# Patient Record
Sex: Male | Born: 1970 | Hispanic: Refuse to answer | Smoking: Never smoker
Health system: Southern US, Community
[De-identification: ages and names within clinical notes are randomized; demographics above are authoritative.]

## PROBLEM LIST (undated history)

## (undated) DIAGNOSIS — M1990 Unspecified osteoarthritis, unspecified site: Secondary | ICD-10-CM

---

## 1997-10-04 ENCOUNTER — Emergency Department (HOSPITAL_COMMUNITY): Admission: EM | Admit: 1997-10-04 | Discharge: 1997-10-04 | Payer: Self-pay | Admitting: Emergency Medicine

## 2004-08-12 ENCOUNTER — Emergency Department (HOSPITAL_COMMUNITY): Admission: EM | Admit: 2004-08-12 | Discharge: 2004-08-12 | Payer: Self-pay | Admitting: Emergency Medicine

## 2004-08-18 ENCOUNTER — Ambulatory Visit: Payer: Self-pay | Admitting: Orthopedic Surgery

## 2004-09-07 ENCOUNTER — Ambulatory Visit: Payer: Self-pay | Admitting: Orthopedic Surgery

## 2009-10-02 ENCOUNTER — Emergency Department (HOSPITAL_COMMUNITY): Admission: EM | Admit: 2009-10-02 | Discharge: 2009-10-02 | Payer: Self-pay | Admitting: Emergency Medicine

## 2012-08-25 ENCOUNTER — Other Ambulatory Visit: Payer: Self-pay | Admitting: Family Medicine

## 2012-12-24 ENCOUNTER — Other Ambulatory Visit: Payer: Self-pay | Admitting: Family Medicine

## 2013-01-09 ENCOUNTER — Encounter: Payer: Self-pay | Admitting: Family Medicine

## 2013-01-09 ENCOUNTER — Ambulatory Visit (INDEPENDENT_AMBULATORY_CARE_PROVIDER_SITE_OTHER): Payer: BC Managed Care – PPO | Admitting: Family Medicine

## 2013-01-09 VITALS — BP 134/84 | Ht 71.0 in | Wt 288.8 lb

## 2013-01-09 DIAGNOSIS — F339 Major depressive disorder, recurrent, unspecified: Secondary | ICD-10-CM | POA: Insufficient documentation

## 2013-01-09 DIAGNOSIS — G4700 Insomnia, unspecified: Secondary | ICD-10-CM | POA: Insufficient documentation

## 2013-01-09 DIAGNOSIS — F329 Major depressive disorder, single episode, unspecified: Secondary | ICD-10-CM

## 2013-01-09 DIAGNOSIS — K219 Gastro-esophageal reflux disease without esophagitis: Secondary | ICD-10-CM | POA: Insufficient documentation

## 2013-01-09 MED ORDER — ESCITALOPRAM OXALATE 20 MG PO TABS: 20.0000 mg | ORAL_TABLET | Freq: Every day | ORAL | Status: DC

## 2013-01-09 NOTE — Progress Notes (Signed)
  Subjective:    Patient ID: Curtis Robertson, male    DOB: 1970/06/29, 42 y.o.   MRN: [id number]  HPI Patient arrives to follow up on Lexapro. Ran out of meds. Still exercising  Gaining weight. Concerning to pt.  Mos side folks gain weight.  Exercise--lifting weights, but not cardio.  Doing well with no problems or concerns.   Review of Systems No chest pain no back pain no abdominal pain. Some reflux on occasion. Some trouble sleeping at night ROS otherwise negative    Objective:   Physical Exam Alert HEENT normal. Lungs clear. Heart rare rhythm. Abdomen benign. Ankles without edema.       Assessment & Plan:  Impression 1 reflux discussed #2 insomnia discussed. #3 depression discussed plan resume Lexapro 20 mg 1 daily. Patient states he at times breaks [place] in half I encouraged him to try to stick with one dose. Exercise diet discussed symptomatic care discussed. WSL

## 2013-04-30 ENCOUNTER — Encounter: Payer: Self-pay | Admitting: Family Medicine

## 2013-04-30 ENCOUNTER — Ambulatory Visit (HOSPITAL_COMMUNITY)
Admission: RE | Admit: 2013-04-30 | Discharge: 2013-04-30 | Disposition: A | Discharge status: Home / Self Care | Payer: BC Managed Care – PPO | Source: Ambulatory Visit | Attending: Family Medicine | Admitting: Family Medicine

## 2013-04-30 ENCOUNTER — Ambulatory Visit (INDEPENDENT_AMBULATORY_CARE_PROVIDER_SITE_OTHER): Payer: BC Managed Care – PPO | Admitting: Family Medicine

## 2013-04-30 VITALS — BP 138/82 | Ht 71.0 in | Wt 296.8 lb

## 2013-04-30 DIAGNOSIS — M25569 Pain in unspecified knee: Secondary | ICD-10-CM | POA: Insufficient documentation

## 2013-04-30 DIAGNOSIS — M25561 Pain in right knee: Secondary | ICD-10-CM

## 2013-04-30 MED ORDER — NABUMETONE 750 MG PO TABS: 750.0000 mg | ORAL_TABLET | Freq: Every day | ORAL | Status: DC

## 2013-04-30 MED ORDER — NABUMETONE 750 MG PO TABS: 750.0000 mg | ORAL_TABLET | Freq: Two times a day (BID) | ORAL | Status: DC

## 2013-04-30 NOTE — Progress Notes (Signed)
   Subjective:    Patient ID: Curtis CosierJuan F Robertson, male    DOB: 1970-06-25, 43 y.o.   MRN: [id number]  HPI Patient arrives with complaint of right pain for about a year but on Monday while doing squats it got worst and popped this am with intense pain.  Recent pain and tend, catching at times  Plus burning  Taking ibuprofen,  Doing sig squats and lifting of weights  Right knee pain--doing squats had pain and subsequent burn, Review of Systems No pain elsewhere no chest pain no back pain ROS:    Objective:   Physical Exam   Alert no apparent distress. Lungs clear. Heart regular in rhythm. Right knee smile effusion. Positive crepitations palpated. Positive lateral patellar tenderness. No dislocation no joint line tenderness. 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 Orthotopic so. Relafen twice a day. Local measures discussed. Pending back on activities discussed. WSL

## 2013-05-02 ENCOUNTER — Telehealth: Payer: Self-pay | Admitting: Family Medicine

## 2013-05-02 NOTE — Telephone Encounter (Signed)
Ok, let brend know to change to [facility]

## 2013-05-02 NOTE — Telephone Encounter (Signed)
[facility]

## 2013-05-02 NOTE — Progress Notes (Signed)
Discussed with patient. Pt stated he will call back in a couple of hours to let us know who he wants to see.

## 2013-06-16 ENCOUNTER — Other Ambulatory Visit: Payer: Self-pay | Admitting: Orthopaedic Surgery

## 2013-06-16 DIAGNOSIS — M25561 Pain in right knee: Secondary | ICD-10-CM

## 2013-06-22 ENCOUNTER — Ambulatory Visit
Admission: RE | Admit: 2013-06-22 | Discharge: 2013-06-22 | Disposition: A | Discharge status: Home / Self Care | Payer: BC Managed Care – PPO | Source: Ambulatory Visit | Attending: Orthopaedic Surgery | Admitting: Orthopaedic Surgery

## 2013-06-22 DIAGNOSIS — M25561 Pain in right knee: Secondary | ICD-10-CM

## 2014-01-13 ENCOUNTER — Other Ambulatory Visit: Payer: Self-pay | Admitting: Family Medicine

## 2014-03-16 ENCOUNTER — Other Ambulatory Visit: Payer: Self-pay | Admitting: Family Medicine

## 2014-04-07 ENCOUNTER — Encounter: Payer: Self-pay | Admitting: Family Medicine

## 2014-04-07 ENCOUNTER — Ambulatory Visit (INDEPENDENT_AMBULATORY_CARE_PROVIDER_SITE_OTHER): Payer: BC Managed Care – PPO | Admitting: Family Medicine

## 2014-04-07 VITALS — BP 150/90 | Temp 98.2°F | Ht 71.0 in | Wt 314.4 lb

## 2014-04-07 DIAGNOSIS — J329 Chronic sinusitis, unspecified: Secondary | ICD-10-CM

## 2014-04-07 DIAGNOSIS — J310 Chronic rhinitis: Secondary | ICD-10-CM

## 2014-04-07 MED ORDER — ESCITALOPRAM OXALATE 20 MG PO TABS: ORAL_TABLET | ORAL | Status: DC

## 2014-04-07 MED ORDER — AMOXICILLIN-POT CLAVULANATE 875-125 MG PO TABS: 1.0000 | ORAL_TABLET | Freq: Two times a day (BID) | ORAL | Status: AC

## 2014-04-07 NOTE — Progress Notes (Signed)
   Subjective:    Patient ID: Aggie CosierJuan F Oquin, male    DOB: 1970/07/14, 43 y.o.   MRN: [id number]  Sinusitis This is a new problem. The current episode started 1 to 4 weeks ago. The problem is unchanged. There has been no fever. The pain is moderate. Associated symptoms include congestion and ear pain. Past treatments include oral decongestants. The treatment provided no relief.  Patient has no other concerns.   Patient on generic Lexapro. States still definitely helping. Would like to stay on it. No obvious side effects. Review of Systems  HENT: Positive for congestion and ear pain.    No vomiting no diarrhea    Objective:   Physical Exam  Alert active good hydration left TM retracted positive nasal congestion pharynx normal lungs clear heart rare rhythm.      Assessment & Plan:  Impression 1 sinusitis with left otitis media #2 chronic depression/anxiety clinically stable plan Lexapro refilled. Augmentin twice a day 10 days. Local measures discussed. Blood pressure repeat down to 140/84

## 2014-11-26 ENCOUNTER — Other Ambulatory Visit: Payer: Self-pay | Admitting: Family Medicine

## 2014-11-26 NOTE — Telephone Encounter (Signed)
This and 2 refills needs ov

## 2015-01-06 ENCOUNTER — Ambulatory Visit (INDEPENDENT_AMBULATORY_CARE_PROVIDER_SITE_OTHER): Payer: BLUE CROSS/BLUE SHIELD | Admitting: Family Medicine

## 2015-01-06 ENCOUNTER — Encounter: Payer: Self-pay | Admitting: Family Medicine

## 2015-01-06 VITALS — BP 133/82 | Ht 71.0 in | Wt 311.4 lb

## 2015-01-06 DIAGNOSIS — M25819 Other specified joint disorders, unspecified shoulder: Secondary | ICD-10-CM

## 2015-01-06 DIAGNOSIS — M7540 Impingement syndrome of unspecified shoulder: Secondary | ICD-10-CM | POA: Diagnosis not present

## 2015-01-06 NOTE — Progress Notes (Signed)
   Subjective:    Patient ID: Curtis Robertson, male    DOB: 1971-03-01, 44 y.o.   MRN: [id number]  HPI Patient arrives with c/o right shoulder pain for years -has had shots in the joint in the past. Testing 123  Patient states in the past injections of definitely helped.  He would prefer to not have to go see an orthopedic surgeon.   Has been doing more physical activity lately  Last couple months has had trouble with progressive pain  Hx of arthritis and tendonitis nd inflammation   Review of Systems No neck pain no Jovahn numbness no arm weakness    Objective:   Physical Exam  Alert vital stable lungs clear. Heart regular rhythm H&T normal shoulder positive impingement sign  Patient was prepped draped injected 1 mL Depo-Medrol 2 mL Xylocaine      Assessment & Plan:  Impression sterile an injection shoulder plan Codman's exercises dictated WSL

## 2015-01-10 MED ORDER — METHYLPREDNISOLONE ACETATE 40 MG/ML IJ SUSP: 40.0000 mg | Freq: Once | INTRAMUSCULAR | Status: DC

## 2015-04-10 ENCOUNTER — Other Ambulatory Visit: Payer: Self-pay | Admitting: Family Medicine

## 2015-04-13 NOTE — Telephone Encounter (Signed)
Dr. Steves patient 

## 2015-04-20 ENCOUNTER — Telehealth: Payer: Self-pay | Admitting: Family Medicine

## 2015-04-20 MED ORDER — ESCITALOPRAM OXALATE 20 MG PO TABS: ORAL_TABLET | ORAL | Status: DC

## 2015-04-20 NOTE — Addendum Note (Signed)
Addended by: Margaretha SheffieldBROWN, AUTUMN S on: 04/20/2015 05:11 PM   Modules accepted: Orders

## 2015-04-20 NOTE — Telephone Encounter (Signed)
Pt is requesting a refill on his escitalopram.    [facility]

## 2015-04-20 NOTE — Telephone Encounter (Signed)
Rx sent electronically to pharmacy. Patient notified. 

## 2015-04-26 ENCOUNTER — Ambulatory Visit (INDEPENDENT_AMBULATORY_CARE_PROVIDER_SITE_OTHER): Payer: BLUE CROSS/BLUE SHIELD | Admitting: Family Medicine

## 2015-04-26 ENCOUNTER — Encounter: Payer: Self-pay | Admitting: Family Medicine

## 2015-04-26 VITALS — BP 118/70 | Temp 98.3°F | Ht 71.0 in | Wt 321.4 lb

## 2015-04-26 DIAGNOSIS — J019 Acute sinusitis, unspecified: Secondary | ICD-10-CM | POA: Diagnosis not present

## 2015-04-26 DIAGNOSIS — H9201 Otalgia, right ear: Secondary | ICD-10-CM | POA: Diagnosis not present

## 2015-04-26 DIAGNOSIS — B9689 Other specified bacterial agents as the cause of diseases classified elsewhere: Secondary | ICD-10-CM

## 2015-04-26 MED ORDER — AMOXICILLIN-POT CLAVULANATE 875-125 MG PO TABS: 1.0000 | ORAL_TABLET | Freq: Two times a day (BID) | ORAL | Status: DC

## 2015-04-26 NOTE — Progress Notes (Signed)
   Subjective:    Patient ID: Curtis Robertson, male    DOB: [birth year], 45 y.o.   MRN: [id number]  Otalgia  There is pain in the right ear. This is a new problem. The current episode started 1 to 4 weeks ago. The problem has been unchanged. There has been no fever. The pain is moderate. Associated symptoms include coughing, rhinorrhea and a sore throat. He has tried ear drops for the symptoms. The treatment provided no relief.   Patient states that he has no other concerns today.  Patient related that started a few weeks ago was slight pain to the right ear then started having some head congestion drainage along with mucoid drainage and some coughing denies any high fever or chills.  Review of Systems  Constitutional: Negative for fever and activity change.  HENT: Positive for congestion, ear pain, rhinorrhea and sore throat.   Eyes: Negative for discharge.  Respiratory: Positive for cough. Negative for wheezing.   Cardiovascular: Negative for chest pain.       Objective:   Physical Exam  Constitutional: He appears well-developed.  HENT:  Head: Normocephalic.  Mouth/Throat: Oropharynx is clear and moist. No oropharyngeal exudate.  Neck: Normal range of motion.  Cardiovascular: Normal rate, regular rhythm and normal heart sounds.   No murmur heard. Pulmonary/Chest: Effort normal and breath sounds normal. He has no wheezes.  Lymphadenopathy:    He has no cervical adenopathy.  Neurological: He exhibits normal muscle tone.  Skin: Skin is warm and dry.  Nursing note and vitals reviewed.         Assessment & Plan:  Otalgia-right ear, eardrum red and, no fluid behind it.  Secondary rhinosinusitis with referred pain to the ear  If progressive troubles or worse follow-up

## 2015-06-08 ENCOUNTER — Other Ambulatory Visit: Payer: Self-pay | Admitting: *Deleted

## 2015-06-08 MED ORDER — ESCITALOPRAM OXALATE 20 MG PO TABS: ORAL_TABLET | ORAL | Status: DC

## 2015-10-07 ENCOUNTER — Other Ambulatory Visit: Payer: Self-pay | Admitting: Family Medicine

## 2015-10-07 NOTE — Telephone Encounter (Signed)
#  30 needs office

## 2015-11-20 ENCOUNTER — Other Ambulatory Visit: Payer: Self-pay | Admitting: Family Medicine

## 2015-11-22 NOTE — Telephone Encounter (Signed)
1 refill needs office visit for this medication

## 2016-02-16 ENCOUNTER — Other Ambulatory Visit: Payer: Self-pay | Admitting: *Deleted

## 2016-02-16 ENCOUNTER — Telehealth: Payer: Self-pay | Admitting: Family Medicine

## 2016-02-16 MED ORDER — ESCITALOPRAM OXALATE 20 MG PO TABS: 20.0000 mg | ORAL_TABLET | Freq: Every day | ORAL | 0 refills | Status: DC

## 2016-02-16 NOTE — Telephone Encounter (Signed)
30 d only, no chronic visit on this for quite awhile needs appt for furhter

## 2016-02-16 NOTE — Telephone Encounter (Signed)
Discussed with pt. Pt states he will call back after looking at his schedule. 30 day supply sent to pharm.

## 2016-02-16 NOTE — Telephone Encounter (Signed)
Pt is requesting a refill on his escitalopram (LEXAPRO) 20 MG tablet   [facility]

## 2016-04-08 ENCOUNTER — Other Ambulatory Visit: Payer: Self-pay | Admitting: Family Medicine

## 2016-04-11 NOTE — Telephone Encounter (Signed)
30 d needs appt

## 2016-07-05 ENCOUNTER — Other Ambulatory Visit: Payer: Self-pay | Admitting: *Deleted

## 2016-07-05 ENCOUNTER — Telehealth: Payer: Self-pay | Admitting: Family Medicine

## 2016-07-05 MED ORDER — ESCITALOPRAM OXALATE 20 MG PO TABS: 20.0000 mg | ORAL_TABLET | Freq: Every day | ORAL | 0 refills | Status: DC

## 2016-07-05 NOTE — Telephone Encounter (Signed)
Pt has appt here 07/14/16 for med check Is completely out, requesting refill for enough to get him through until appt   escitalopram (LEXAPRO) 20 MG tablet   [facility]  Please advise & call pt when done

## 2016-07-05 NOTE — Telephone Encounter (Signed)
Med sent to pharm. Tried to call no answer to notify pt.  

## 2016-07-05 NOTE — Telephone Encounter (Signed)
Ok one mo 

## 2016-07-05 NOTE — Telephone Encounter (Signed)
Refill sent. Pt notified.

## 2016-07-14 ENCOUNTER — Ambulatory Visit (INDEPENDENT_AMBULATORY_CARE_PROVIDER_SITE_OTHER): Payer: BLUE CROSS/BLUE SHIELD | Admitting: Family Medicine

## 2016-07-14 ENCOUNTER — Encounter: Payer: Self-pay | Admitting: Family Medicine

## 2016-07-14 VITALS — BP 124/84 | Ht 71.0 in | Wt 303.4 lb

## 2016-07-14 DIAGNOSIS — Z79899 Other long term (current) drug therapy: Secondary | ICD-10-CM | POA: Diagnosis not present

## 2016-07-14 DIAGNOSIS — F321 Major depressive disorder, single episode, moderate: Secondary | ICD-10-CM

## 2016-07-14 MED ORDER — ESCITALOPRAM OXALATE 20 MG PO TABS: 20.0000 mg | ORAL_TABLET | Freq: Every day | ORAL | 11 refills | Status: DC

## 2016-07-14 NOTE — Progress Notes (Signed)
   Subjective:    Patient ID: Curtis Robertson, male    DOB: [birth year], 46 y.o.   MRN: [id number]  Depression         This is a chronic problem.  The current episode started more than 1 year ago.   Patient states no other concerns this visit.  Patient notes ongoing compliance with antidepressant medication. No obvious side effects. Reports does not miss a dose. Overall continues to help depression substantially. No thoughts of homicide or suicide. Would like to maintain medication.  Feeling he definietyly needs the meds, helps works well  Staying busytrying to exercise motr  take meds faithfully                                                                                                 Had right knee surg, now better  Review of Systems  Psychiatric/Behavioral: Positive for depression.       Objective:   Physical Exam Alert vitals stable, NAD. Blood pressure good on repeat. HEENT normal. Lungs clear. Heart regular rate and rhythm.        Assessment & Plan:  Impression depression long-standing. States deftly does better on the medication. Has not had blood work for quite a while. Plan Lexapro refilled. One years worth. Diet exercise discussed. Appropriate blood work further recommendations based on results

## 2016-07-16 ENCOUNTER — Encounter: Payer: Self-pay | Admitting: Family Medicine

## 2016-07-16 LAB — HEPATIC FUNCTION PANEL
ALT: 29 IU/L (ref 0–44)
AST: 27 IU/L (ref 0–40)
Albumin: 4.3 g/dL (ref 3.5–5.5)
Alkaline Phosphatase: 79 IU/L (ref 39–117)
BILIRUBIN TOTAL: 0.3 mg/dL (ref 0.0–1.2)
BILIRUBIN, DIRECT: 0.1 mg/dL (ref 0.00–0.40)
Total Protein: 7.3 g/dL (ref 6.0–8.5)

## 2016-07-16 LAB — BASIC METABOLIC PANEL
BUN/Creatinine Ratio: 11 (ref 9–20)
BUN: 11 mg/dL (ref 6–24)
CALCIUM: 9.3 mg/dL (ref 8.7–10.2)
CHLORIDE: 102 mmol/L (ref 96–106)
CO2: 26 mmol/L (ref 18–29)
Creatinine, Ser: 1.02 mg/dL (ref 0.76–1.27)
GFR calc non Af Amer: 88 mL/min/{1.73_m2} (ref >59)
GFR, EST AFRICAN AMERICAN: 101 mL/min/{1.73_m2} (ref >59)
Glucose: 107 mg/dL — ABNORMAL HIGH (ref 65–99)
POTASSIUM: 4.3 mmol/L (ref 3.5–5.2)
Sodium: 143 mmol/L (ref 134–144)

## 2016-07-16 LAB — LIPID PANEL
CHOL/HDL RATIO: 4 ratio (ref 0.0–5.0)
CHOLESTEROL TOTAL: 152 mg/dL (ref 100–199)
HDL: 38 mg/dL — ABNORMAL LOW (ref >39)
LDL Calculated: 95 mg/dL (ref 0–99)
TRIGLYCERIDES: 93 mg/dL (ref 0–149)
VLDL Cholesterol Cal: 19 mg/dL (ref 5–40)

## 2016-10-12 ENCOUNTER — Other Ambulatory Visit: Payer: Self-pay

## 2016-10-12 MED ORDER — ESCITALOPRAM OXALATE 20 MG PO TABS: 20.0000 mg | ORAL_TABLET | Freq: Every day | ORAL | 1 refills | Status: DC

## 2017-06-11 ENCOUNTER — Other Ambulatory Visit: Payer: Self-pay | Admitting: *Deleted

## 2017-06-11 MED ORDER — ESCITALOPRAM OXALATE 20 MG PO TABS: 20.0000 mg | ORAL_TABLET | Freq: Every day | ORAL | 1 refills | Status: DC

## 2017-06-11 NOTE — Telephone Encounter (Signed)
Ok but also sched o v

## 2017-06-14 NOTE — Telephone Encounter (Signed)
Card mailed to pt to schedule office visit

## 2017-09-11 ENCOUNTER — Encounter: Payer: Self-pay | Admitting: Family Medicine

## 2017-09-11 ENCOUNTER — Ambulatory Visit (INDEPENDENT_AMBULATORY_CARE_PROVIDER_SITE_OTHER): Payer: Commercial Managed Care - PPO | Admitting: Family Medicine

## 2017-09-11 ENCOUNTER — Encounter: Payer: Self-pay | Admitting: *Deleted

## 2017-09-11 VITALS — BP 142/94 | Temp 99.6°F | Ht 71.0 in | Wt 303.6 lb

## 2017-09-11 DIAGNOSIS — J310 Chronic rhinitis: Secondary | ICD-10-CM

## 2017-09-11 DIAGNOSIS — R300 Dysuria: Secondary | ICD-10-CM | POA: Diagnosis not present

## 2017-09-11 DIAGNOSIS — N410 Acute prostatitis: Secondary | ICD-10-CM

## 2017-09-11 DIAGNOSIS — F339 Major depressive disorder, recurrent, unspecified: Secondary | ICD-10-CM

## 2017-09-11 DIAGNOSIS — J329 Chronic sinusitis, unspecified: Secondary | ICD-10-CM

## 2017-09-11 LAB — POCT URINALYSIS DIPSTICK
Protein, UA: POSITIVE — AB
Spec Grav, UA: 1.02 (ref 1.010–1.025)
pH, UA: 7 (ref 5.0–8.0)

## 2017-09-11 MED ORDER — ESCITALOPRAM OXALATE 20 MG PO TABS: 20.0000 mg | ORAL_TABLET | Freq: Every day | ORAL | 3 refills | Status: DC

## 2017-09-11 MED ORDER — CIPROFLOXACIN HCL 750 MG PO TABS: 750.0000 mg | ORAL_TABLET | Freq: Two times a day (BID) | ORAL | 0 refills | Status: AC

## 2017-09-11 NOTE — Progress Notes (Signed)
   Subjective:    Patient ID: NTHONY LEFFERTS, male    DOB: July 02, 1970, 47 y.o.   MRN: [id number] Patient arrives with several distinct concern Fever   This is a new problem. The current episode started 1 to 4 weeks ago. The maximum temperature noted was 100 to 100.9 F. The temperature was taken using an oral thermometer. Associated symptoms include congestion, coughing, headaches and muscle aches. Associated symptoms comments: Body aches. He has tried acetaminophen (Mucinex, Elderberry syrup) for the symptoms.    Teledoc Aug 19 2017; prescribed ABT. Cleared up but was still coughing up phelgm; Friday started feeling achy, Saturday felt real achey all over, temp was 100.6; taken Tylenol Mucinex, Elderberry syrup for cough; head hurts when cough, night sweats, low back pain with some discomfort when urinating.   The teledoc folks called in antibiotics  Pt took all the meds, did not have hs usal energy level   Still bringing p phleg and congestion  Friday got to feeling worse, sat stayed between bed and cough   Patient also notes dysuria.  Progressive over the past week.  Also urinary hesitancy.  Also urinary frequency.  Some low back discomfort.  No obvious penile discharge.  Pt noted low gr fever, felt dim energy    took tyke prn for fever and chills    Review of Systems  Constitutional: Positive for fever.  HENT: Positive for congestion.   Respiratory: Positive for cough.   Neurological: Positive for headaches.   Results for orders placed or performed in visit on 09/11/17  POCT Urinalysis Dipstick  Result Value Ref Range   Color, UA     Clarity, UA     Glucose, UA  Negative   Bilirubin, UA +    Ketones, UA     Spec Grav, UA 1.020 1.010 - 1.025   Blood, UA     pH, UA 7.0 5.0 - 8.0   Protein, UA Positive (A) Negative   Urobilinogen, UA  0.2 or 1.0 E.U./dL   Nitrite, UA     Leukocytes, UA  Negative   Appearance     Odor        Objective:   Physical Exam Alert and  oriented, vitals reviewed and stable, NAD ENT-TM's and ext canals/positive nasal congestion stuffiness and frontal tenderness WNL bilat via otoscopic exam Soft palate, tonsils and post pharynx WNL via oropharyngeal exam Neck-symmetric, no masses; thyroid nonpalpable and nontender Pulmonary-no tachypnea or accessory muscle use; Clear without wheezes via auscultation Card--no abnrml murmurs, rhythm reg and rate WNL Carotid pulses symmetric, without bruits No CVA tenderness prostate gland boggy tender  Urinalysis numerous white blood cell     Assessment & Plan:  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  On amoxicillin in the past.  We will switch to Cipro.  See #2.  2.  Acute prostatitis.  Nature disease discussed with patient.  Cipro 750 twice daily 21 days.  Rationale discussed for treatment.  3.  Chronic depression element of anxiety patient states medication definitely helping.  Would like to stay on no obvious side effects 1 years worth written

## 2018-04-18 ENCOUNTER — Other Ambulatory Visit: Payer: Self-pay | Admitting: *Deleted

## 2018-04-18 ENCOUNTER — Telehealth: Payer: Self-pay | Admitting: Family Medicine

## 2018-04-18 MED ORDER — ESCITALOPRAM OXALATE 20 MG PO TABS: 20.0000 mg | ORAL_TABLET | Freq: Every day | ORAL | 0 refills | Status: DC

## 2018-04-18 NOTE — Telephone Encounter (Signed)
Last seen 09/11/17

## 2018-04-18 NOTE — Telephone Encounter (Signed)
Ok times one 

## 2018-04-18 NOTE — Telephone Encounter (Signed)
Refill sent to pharm. Tried to call wireless customer not available at this time. Unable to leave message.

## 2018-04-18 NOTE — Telephone Encounter (Signed)
Pt requesting a refill for escitalopram (LEXAPRO) 20 MG tablet   Med check 04/22/2018   Ph[facility]  [id number] [place] [place] - [address] AT [facility]

## 2018-04-22 ENCOUNTER — Encounter: Payer: Self-pay | Admitting: Family Medicine

## 2018-04-22 ENCOUNTER — Ambulatory Visit (INDEPENDENT_AMBULATORY_CARE_PROVIDER_SITE_OTHER): Payer: Commercial Managed Care - PPO | Admitting: Family Medicine

## 2018-04-22 VITALS — BP 146/92 | Ht 71.0 in | Wt 309.4 lb

## 2018-04-22 DIAGNOSIS — F339 Major depressive disorder, recurrent, unspecified: Secondary | ICD-10-CM | POA: Diagnosis not present

## 2018-04-22 MED ORDER — ESCITALOPRAM OXALATE 20 MG PO TABS: 20.0000 mg | ORAL_TABLET | Freq: Every day | ORAL | 3 refills | Status: DC

## 2018-04-22 NOTE — Progress Notes (Signed)
   Subjective:    Patient ID: Curtis Robertson, male    DOB: [birth year], 48 y.o.   MRN: [id number]  Depression         This is a chronic problem.( Pt states things have been going good; doing ok on medication)  Compliance with treatment is good. pt here for medication check up.  Patient notes ongoing compliance with antidepressant medication. No obvious side effects. Reports does not miss a dose. Overall continues to help depression substantially. No thoughts of homicide or suicide. Would like to maintain medication.  Overall  Med working well   Tolerating welll    exercise going good  Works out daily, exzdept on sundays   still running  Doing two or three miles running  Hyperextended knee with stairs activity      Review of Systems  Psychiatric/Behavioral: Positive for depression.       Objective:   Physical Exam   Alert vitals stable, NAD. Blood pressure good on repeat. HEENT normal. Lungs clear. Heart regular rate and rhythm.      Assessment & Plan:    depr  linically stable tolerating medications well.  No obvious side effects.  Compliance discussed.  Medication refill.  Exercise encouraged.  Check yearly with patient being very compliant

## 2018-04-22 NOTE — Telephone Encounter (Signed)
Patient seen in office 04/22/2018

## 2018-06-06 ENCOUNTER — Encounter: Payer: Self-pay | Admitting: Family Medicine

## 2018-06-06 ENCOUNTER — Ambulatory Visit (INDEPENDENT_AMBULATORY_CARE_PROVIDER_SITE_OTHER): Payer: Commercial Managed Care - PPO | Admitting: Family Medicine

## 2018-06-06 VITALS — BP 160/104 | Temp 99.3°F | Wt 295.0 lb

## 2018-06-06 DIAGNOSIS — J111 Influenza due to unidentified influenza virus with other respiratory manifestations: Secondary | ICD-10-CM | POA: Diagnosis not present

## 2018-06-06 MED ORDER — ALBUTEROL SULFATE HFA 108 (90 BASE) MCG/ACT IN AERS
2.0000 | INHALATION_SPRAY | Freq: Four times a day (QID) | RESPIRATORY_TRACT | 2 refills | Status: DC | PRN
Start: 2018-06-06 — End: 2019-08-05

## 2018-06-06 MED ORDER — OSELTAMIVIR PHOSPHATE 75 MG PO CAPS: 75.0000 mg | ORAL_CAPSULE | Freq: Two times a day (BID) | ORAL | 0 refills | Status: AC

## 2018-06-06 NOTE — Progress Notes (Addendum)
   Subjective:    Patient ID: Curtis Robertson, male    DOB: [birth year], 48 y.o.   MRN: [id number]  HPI Patient is here today with complaints of a cough,wheezing,runny nose, headache, body and joint aches, fever.  Symptoms started on Tuesday afternoon.  He has been taking Tylenol and alt with Ibuprofen.  achey joints and muscles  Dim energy   Felt fever   baf headache at times  tmax 101.3  Took tyl and ibu prn   Feeling uny at times    Did get a flu shot    Some productive   Yellow t  Energy  Level down   Appetite ok  Review of Systems No headache, no major weight loss or weight gain, no chest pain no back pain abdominal pain no change in bowel habits complete ROS otherwise negative     Objective:   Physical Exam Alert vitals reviewed, moderate malaise. Hydration good. Positive nasal congestion lungs no crackles or wheezes, no tachypnea, intermittent bronchial cough during exam heart regular rate and rhythm.        Assessment & Plan:  Impression influenza discussed at length. Ashby Dawes of illness and potential sequela discussed. Plan Tamiflu prescribed if indicated and timing appropriate. Symptom care discussed. Warning signs discussed. WSL

## 2018-12-13 ENCOUNTER — Other Ambulatory Visit: Payer: Self-pay

## 2018-12-13 DIAGNOSIS — Z20822 Contact with and (suspected) exposure to covid-19: Secondary | ICD-10-CM

## 2018-12-14 LAB — NOVEL CORONAVIRUS, NAA: SARS-CoV-2, NAA: NOT DETECTED

## 2019-05-21 ENCOUNTER — Encounter: Payer: Self-pay | Admitting: Family Medicine

## 2019-08-05 ENCOUNTER — Other Ambulatory Visit: Payer: Self-pay | Admitting: Family Medicine

## 2019-08-05 NOTE — Telephone Encounter (Signed)
Last seen 06/06/18

## 2019-09-01 ENCOUNTER — Other Ambulatory Visit: Payer: Self-pay | Admitting: Family Medicine

## 2019-09-01 NOTE — Telephone Encounter (Signed)
Please contact patient to have him set up appointment. Then may route back to nurses. Thank you 

## 2019-09-01 NOTE — Telephone Encounter (Signed)
Please contact patient to have him set up appointment. Then may route back to nurses. Thank you

## 2019-09-02 NOTE — Telephone Encounter (Signed)
Called pt. vm box not set up

## 2019-09-03 NOTE — Telephone Encounter (Signed)
Last seen for depression on 04/22/18. Has upcoming appt on 09/22/19

## 2019-09-03 NOTE — Telephone Encounter (Signed)
Scheduled 6/7

## 2019-09-16 ENCOUNTER — Other Ambulatory Visit: Payer: Self-pay | Admitting: Family Medicine

## 2019-09-22 ENCOUNTER — Encounter: Payer: Self-pay | Admitting: Family Medicine

## 2019-09-22 ENCOUNTER — Other Ambulatory Visit: Payer: Self-pay

## 2019-09-22 ENCOUNTER — Ambulatory Visit (INDEPENDENT_AMBULATORY_CARE_PROVIDER_SITE_OTHER): Payer: Managed Care, Other (non HMO) | Admitting: Family Medicine

## 2019-09-22 VITALS — BP 126/88 | HR 88 | Temp 98.1°F | Ht 71.0 in | Wt 309.8 lb

## 2019-09-22 DIAGNOSIS — F339 Major depressive disorder, recurrent, unspecified: Secondary | ICD-10-CM | POA: Diagnosis not present

## 2019-09-22 DIAGNOSIS — I10 Essential (primary) hypertension: Secondary | ICD-10-CM

## 2019-09-22 DIAGNOSIS — M25562 Pain in left knee: Secondary | ICD-10-CM | POA: Diagnosis not present

## 2019-09-22 DIAGNOSIS — M25561 Pain in right knee: Secondary | ICD-10-CM | POA: Diagnosis not present

## 2019-09-22 NOTE — Progress Notes (Signed)
Patient ID: Curtis Robertson, male    DOB: [birth year], 49 y.o.   MRN: [id number]   Chief Complaint  Patient presents with  . Depression    needs refill on lexapro. no problems with med  . joint pain    pain in knees and elbows   Subjective:    HPI Pt seen for f/u depression. Pt also mentioning b/l knee pain. Pt had h/o surgery on rt knee due to small ACL tear.  Bilateral knee pain medial area.  Sitting down for 5 mins or longer and go to get up. Better after walking. Does go to gym and running on treadmill.  No new injuries or trauma to knee.  Depression- Has been on lexapro 20mg  for 10 yrs.  Doing well. Wanting to continue with this medication.  Has eating some salt in diet.  Not adding as much salt.  Lots of salt and fried meals growing up.  Pt delcining labs.  Medical History Curtis Robertson has no past medical history on file.   Outpatient Encounter Medications as of 09/22/2019  Medication Sig  . escitalopram (LEXAPRO) 20 MG tablet TAKE 1 TABLET BY MOUTH EVERY DAY  . [DISCONTINUED] amoxicillin-clavulanate (AUGMENTIN) 875-125 MG tablet Take 1 tablet by mouth 2 (two) times daily. (Patient not taking: Reported on 07/14/2016)  . [DISCONTINUED] nabumetone (RELAFEN) 750 MG tablet Take 1 tablet (750 mg total) by mouth 2 (two) times daily. (Patient not taking: Reported on 07/14/2016)  . [DISCONTINUED] PROAIR HFA 108 (90 Base) MCG/ACT inhaler TAKE 2 PUFFS BY MOUTH EVERY 6 HOURS AS NEEDED FOR WHEEZE OR SHORTNESS OF BREATH   No facility-administered encounter medications on file as of 09/22/2019.     Review of Systems  Constitutional: Negative for chills and fever.  HENT: Negative for congestion, rhinorrhea and sore throat.   Respiratory: Negative for cough, shortness of breath and wheezing.   Cardiovascular: Negative for chest pain and leg swelling.  Gastrointestinal: Negative for abdominal pain, diarrhea, nausea and vomiting.  Genitourinary: Negative for dysuria and frequency.    Musculoskeletal: Positive for arthralgias (bilateral knee pain).  Skin: Negative for rash.  Neurological: Negative for dizziness, weakness and headaches.  Psychiatric/Behavioral: Negative for agitation, behavioral problems, confusion, dysphoric mood, self-injury, sleep disturbance and suicidal ideas. The patient is not nervous/anxious.      Vitals BP 126/88   Pulse 88   Temp 98.1 F (36.7 C)   Ht 5\' 11"  (1.803 m)   Wt (!) 309 lb 12.8 oz (140.5 kg)   SpO2 97%   BMI 43.21 kg/m   Objective:   Physical Exam Vitals and nursing note reviewed.  Constitutional:      General: He is not in acute distress.    Appearance: Normal appearance. He is obese. He is not ill-appearing.  HENT:     Head: Normocephalic.     Nose: Nose normal. No congestion.     Mouth/Throat:     Mouth: Mucous membranes are moist.     Pharynx: No oropharyngeal exudate.  Eyes:     Extraocular Movements: Extraocular movements intact.     Conjunctiva/sclera: Conjunctivae normal.     Pupils: Pupils are equal, round, and reactive to light.  Cardiovascular:     Rate and Rhythm: Normal rate and regular rhythm.     Pulses: Normal pulses.     Heart sounds: Normal heart sounds. No murmur.  Pulmonary:     Effort: Pulmonary effort is normal.     Breath sounds: Normal breath sounds. No wheezing,  rhonchi or rales.  Musculoskeletal:        General: Tenderness (medial bilateral knee pain) present. No swelling, deformity or signs of injury. Normal range of motion.     Right lower leg: No edema.     Left lower leg: No edema.  Skin:    General: Skin is warm and dry.     Findings: No rash.  Neurological:     General: No focal deficit present.     Mental Status: He is alert and oriented to person, place, and time.     Cranial Nerves: No cranial nerve deficit.  Psychiatric:        Mood and Affect: Mood normal.        Behavior: Behavior normal.        Thought Content: Thought content normal.        Judgment: Judgment  normal.      Assessment and Plan   1. Depression, recurrent (HCC)  2. Acute pain of both knees  3. Essential hypertension   Bilateral knee pain- mild, stable. Pt not wanting to take a medication daily yet for knee pain. Advising tylenol or aleve prn.  Try exercising low impact on knees, elliptical or recumbent bike.  Depression- Doing well on lexapro.  HTN- Elevated diastolic- cont to monitor, not taking medications.  Pt wanting to do diet modifications.  Dec salt and work on exercising and wt loss.  Pt in agreement.  Pt declining labs at this time.  F/u 62mo or prn.

## 2019-09-25 ENCOUNTER — Telehealth: Payer: Managed Care, Other (non HMO) | Admitting: Family Medicine

## 2019-09-25 ENCOUNTER — Ambulatory Visit: Payer: Managed Care, Other (non HMO) | Attending: Internal Medicine

## 2019-09-25 ENCOUNTER — Other Ambulatory Visit: Payer: Self-pay

## 2019-09-25 DIAGNOSIS — Z20822 Contact with and (suspected) exposure to covid-19: Secondary | ICD-10-CM

## 2019-09-26 LAB — SARS-COV-2, NAA 2 DAY TAT

## 2019-09-26 LAB — NOVEL CORONAVIRUS, NAA: SARS-CoV-2, NAA: NOT DETECTED

## 2020-04-13 ENCOUNTER — Other Ambulatory Visit: Payer: Self-pay | Admitting: Family Medicine

## 2020-04-13 NOTE — Telephone Encounter (Signed)
Pt needs appt in next 30 days for f/u on depression.  Gave 30 day supply meds.   Thx. Dr. Ladona Ridgel

## 2020-04-13 NOTE — Telephone Encounter (Signed)
Please contact patient and have him set up appt in next 30 days for f/u on depression. Thank you!

## 2020-04-15 NOTE — Telephone Encounter (Signed)
Sent my chart message to schedule appointment.

## 2020-05-17 ENCOUNTER — Other Ambulatory Visit: Payer: Self-pay | Admitting: Family Medicine

## 2020-05-22 ENCOUNTER — Encounter: Payer: Self-pay | Admitting: Emergency Medicine

## 2020-05-22 ENCOUNTER — Other Ambulatory Visit: Payer: Self-pay

## 2020-05-22 ENCOUNTER — Ambulatory Visit
Admission: EM | Admit: 2020-05-22 | Discharge: 2020-05-22 | Disposition: A | Discharge status: Home / Self Care | Payer: Commercial Managed Care - PPO | Attending: Family Medicine | Admitting: Family Medicine

## 2020-05-22 DIAGNOSIS — Z20822 Contact with and (suspected) exposure to covid-19: Secondary | ICD-10-CM

## 2020-05-22 DIAGNOSIS — J069 Acute upper respiratory infection, unspecified: Secondary | ICD-10-CM

## 2020-05-22 DIAGNOSIS — R062 Wheezing: Secondary | ICD-10-CM

## 2020-05-22 HISTORY — DX: Unspecified osteoarthritis, unspecified site: M19.90

## 2020-05-22 MED ORDER — PROMETHAZINE-DM 6.25-15 MG/5ML PO SYRP: 5.0000 mL | ORAL_SOLUTION | Freq: Four times a day (QID) | ORAL | 0 refills | Status: DC | PRN

## 2020-05-22 MED ORDER — PREDNISONE 10 MG (21) PO TBPK: ORAL_TABLET | Freq: Every day | ORAL | 0 refills | Status: AC

## 2020-05-22 NOTE — ED Triage Notes (Signed)
Sinus congestion and headache that started wed.

## 2020-05-22 NOTE — Discharge Instructions (Addendum)
I have sent in a prednisone taper for you to take for 6 days. 6 tablets on day one, 5 tablets on day two, 4 tablets on day three, 3 tablets on day four, 2 tablets on day five, and 1 tablet on day six. ° °I have sent in cough syrup for you to take. This medication can make you sleepy. Do not drive while taking this medication. ° °Your COVID and flu test is pending.  You should self quarantine until the test result is back.   ° °Take Tylenol or ibuprofen as needed for fever or discomfort.  Rest and keep yourself hydrated.   ° °Follow-up with your primary care provider if your symptoms are not improving.   ° ° °

## 2020-05-22 NOTE — ED Provider Notes (Signed)
[facility]   [id number] 05/22/20 Arrival Time: 1010   CC: COVID symptoms  SUBJECTIVE: History from: patient.  Curtis Robertson is a 50 y.o. male who presents with sinus congestion and headache for the last 2 days. Denies sick exposure to COVID, flu or strep. Denies recent travel. Has negative history of Covid. Has completed Covid vaccines. Has not completed flu vaccine this year. Has tried mucinex with little relief. There are no aggravating or alleviating factors. Denies previous symptoms in the past. Denies fever, chills, fatigue, sinus pain, sore throat, SOB, wheezing, chest pain, nausea, changes in bowel or bladder habits.    ROS: As per HPI.  All other pertinent ROS negative.     Past Medical History:  Diagnosis Date  . Arthritis    History reviewed. No pertinent surgical history. No Known Allergies No current facility-administered medications on file prior to encounter.   Current Outpatient Medications on File Prior to Encounter  Medication Sig Dispense Refill  . escitalopram (LEXAPRO) 20 MG tablet TAKE 1 TABLET BY MOUTH EVERY DAY 30 tablet 0   Social History   Socioeconomic History  . Marital status: Legally Separated    Spouse name: Not on file  . Number of children: Not on file  . Years of education: Not on file  . Highest education level: Not on file  Occupational History  . Not on file  Tobacco Use  . Smoking status: Never Smoker  . Smokeless tobacco: Never Used  Substance and Sexual Activity  . Alcohol use: Not Currently  . Drug use: Never  . Sexual activity: Not on file  Other Topics Concern  . Not on file  Social History Narrative  . Not on file   Social Determinants of Health   Financial Resource Strain: Not on file  Food Insecurity: Not on file  Transportation Needs: Not on file  Physical Activity: Not on file  Stress: Not on file  Social Connections: Not on file  Intimate Partner Violence: Not on file   No family history on  file.  OBJECTIVE:  Vitals:   05/22/20 1028  BP: (!) 162/100  Pulse: 83  Resp: 18  Temp: 98.2 F (36.8 C)  TempSrc: Oral  SpO2: 95%  Weight: 300 lb (136.1 kg)  Height: 6' (1.829 m)     General appearance: alert; appears fatigued, but nontoxic; speaking in full sentences and tolerating own secretions HEENT: NCAT; Ears: EACs clear, TMs pearly gray; Eyes: PERRL.  EOM grossly intact. Sinuses: nontender; Nose: nares patent with clear rhinorrhea, Throat: oropharynx erythematous, cobblestoning present, tonsils non erythematous or enlarged, uvula midline  Neck: supple without LAD Lungs: unlabored respirations, symmetrical air entry; cough: mild; no respiratory distress; mild wheezing to bilateral lower lobes Heart: regular rate and rhythm.  Radial pulses 2+ symmetrical bilaterally Skin: warm and dry Psychological: alert and cooperative; normal mood and affect  LABS:  No results found for this or any previous visit (from the past 24 hour(s)).   ASSESSMENT & PLAN:  1. Viral URI with cough   2. Exposure to COVID-19 virus   3. Wheezing     Meds ordered this encounter  Medications  . promethazine-dextromethorphan (PROMETHAZINE-DM) 6.25-15 MG/5ML syrup    Sig: Take 5 mLs by mouth 4 (four) times daily as needed.    Dispense:  118 mL    Refill:  0    Order Specific Question:   Supervising Provider    Answer:   Merrilee Jansky [id number]  . predniSONE (STERAPRED  UNI-PAK 21 TAB) 10 MG (21) TBPK tablet    Sig: Take by mouth daily for 6 days. Take 6 tablets on day 1, 5 tablets on day 2, 4 tablets on day 3, 3 tablets on day 4, 2 tablets on day 5, 1 tablet on day 6    Dispense:  21 tablet    Refill:  0    Order Specific Question:   Supervising Provider    Answer:   Merrilee Jansky [[id number]]    Promethazine cough syrup prescribed Sedation precautions given Prescribed steroid taper Continue supportive care at home COVID and flu testing ordered.  It will take between 2-3 days for  test results. Someone will contact you regarding abnormal results.   Work note provided Patient should remain in quarantine until they have received Covid results.  If negative you may resume normal activities (go back to work/school) while practicing hand hygiene, social distance, and mask wearing.  If positive, patient should remain in quarantine for at least 5 days from symptom onset AND greater than 72 hours after symptoms resolution (absence of fever without the use of fever-reducing medication and improvement in respiratory symptoms), whichever is longer Get plenty of rest and push fluids Use OTC zyrtec for nasal congestion, runny nose, and/or sore throat Use OTC flonase for nasal congestion and runny nose Use medications daily for symptom relief Use OTC medications like ibuprofen or tylenol as needed fever or pain Call or go to the ED if you have any new or worsening symptoms such as fever, worsening cough, shortness of breath, chest tightness, chest pain, turning blue, changes in mental status.  Reviewed expectations re: course of current medical issues. Questions answered. Outlined signs and symptoms indicating need for more acute intervention. Patient verbalized understanding. After Visit Summary given.         Moshe Cipro, NP 05/22/20 1229

## 2020-05-23 LAB — COVID-19, FLU A+B NAA
Influenza A, NAA: NOT DETECTED
Influenza B, NAA: NOT DETECTED
SARS-CoV-2, NAA: DETECTED — AB

## 2020-05-29 ENCOUNTER — Telehealth: Payer: Self-pay | Admitting: Emergency Medicine

## 2020-05-29 DIAGNOSIS — J069 Acute upper respiratory infection, unspecified: Secondary | ICD-10-CM

## 2020-05-29 MED ORDER — PREDNISONE 10 MG PO TABS: 20.0000 mg | ORAL_TABLET | Freq: Every day | ORAL | 0 refills | Status: DC

## 2020-05-29 MED ORDER — PROMETHAZINE-DM 6.25-15 MG/5ML PO SYRP: 5.0000 mL | ORAL_SOLUTION | Freq: Four times a day (QID) | ORAL | 0 refills | Status: DC | PRN

## 2020-05-29 NOTE — Telephone Encounter (Signed)
Patient called requesting antibiotic to be prescribed.  He was told that he has Covid and therefore does not need antibiotic.  He also reports he was not notified about his Covid result.  Documentation say otherwise.  Prednisone and promethazine were refilled to the pharmacy.

## 2020-06-10 ENCOUNTER — Encounter: Payer: Self-pay | Admitting: Emergency Medicine

## 2020-06-10 ENCOUNTER — Ambulatory Visit: Payer: Commercial Managed Care - PPO

## 2020-06-10 ENCOUNTER — Ambulatory Visit
Admission: EM | Admit: 2020-06-10 | Discharge: 2020-06-10 | Disposition: A | Discharge status: Home / Self Care | Payer: Commercial Managed Care - PPO | Attending: Internal Medicine | Admitting: Internal Medicine

## 2020-06-10 ENCOUNTER — Other Ambulatory Visit: Payer: Self-pay

## 2020-06-10 ENCOUNTER — Ambulatory Visit (INDEPENDENT_AMBULATORY_CARE_PROVIDER_SITE_OTHER): Payer: Commercial Managed Care - PPO

## 2020-06-10 DIAGNOSIS — S90111A Contusion of right great toe without damage to nail, initial encounter: Secondary | ICD-10-CM

## 2020-06-10 DIAGNOSIS — M79674 Pain in right toe(s): Secondary | ICD-10-CM

## 2020-06-10 DIAGNOSIS — W208XXA Other cause of strike by thrown, projected or falling object, initial encounter: Secondary | ICD-10-CM

## 2020-06-10 NOTE — ED Provider Notes (Signed)
[facility]    CSN: [id number] Arrival date & time: 06/10/20  1807      History   Chief Complaint No chief complaint on file.   HPI Curtis Robertson is a 50 y.o. male who presents with R great toe pain after dropping a 10 lb weigh from his shoulder hight while at the gym this pain. Denies past hx of injuring this toe before. He was wearing tennis shoes. Pain level right now 4/10.   Past Medical History:  Diagnosis Date  . Arthritis     Patient Active Problem List   Diagnosis Date Noted  . Esophageal reflux 01/09/2013  . Depression, recurrent (HCC) 01/09/2013  . Insomnia 01/09/2013    History reviewed. No pertinent surgical history.   Home Medications    Prior to Admission medications   Medication Sig Start Date End Date Taking? Authorizing Provider  escitalopram (LEXAPRO) 20 MG tablet TAKE 1 TABLET BY MOUTH EVERY DAY 05/19/20   Ladona Ridgel, Malena M, DO  predniSONE (DELTASONE) 10 MG tablet Take 2 tablets (20 mg total) by mouth daily. 05/29/20   Avegno, Zachery Dakins, FNP  promethazine-dextromethorphan (PROMETHAZINE-DM) 6.25-15 MG/5ML syrup Take 5 mLs by mouth 4 (four) times daily as needed. 05/29/20   Avegno, Zachery Dakins, FNP    Family History History reviewed. No pertinent family history.  Social History Social History   Tobacco Use  . Smoking status: Never Smoker  . Smokeless tobacco: Never Used  Substance Use Topics  . Alcohol use: Not Currently  . Drug use: Never     Allergies   Patient has no known allergies.   Review of Systems Review of Systems  Musculoskeletal: Positive for joint swelling. Negative for gait problem.  Skin: Positive for color change. Negative for rash and wound.     Physical Exam Triage Vital Signs ED Triage Vitals  Enc Vitals Group     BP 06/10/20 1818 (!) 86/52     Pulse Rate 06/10/20 1818 84     Resp 06/10/20 1818 17     Temp 06/10/20 1818 98.1 F (36.7 C)     Temp Source 06/10/20 1818 Oral     SpO2 06/10/20 1818 96 %      Weight --      Height --      Head Circumference --      Peak Flow --      Pain Score 06/10/20 1829 5     Pain Loc --      Pain Edu? --      Excl. in GC? --    No data found.  Updated Vital Signs BP (!) 199/96 (BP Location: Right Arm)   Pulse 84   Temp 98.1 F (36.7 C) (Oral)   Resp 17   SpO2 96%   Visual Acuity Right Eye Distance:   Left Eye Distance:   Bilateral Distance:    Right Eye Near:   Left Eye Near:    Bilateral Near:     Physical Exam Vitals and nursing note reviewed.  Constitutional:      General: He is not in acute distress.    Appearance: He is obese. He is not toxic-appearing.  HENT:     Right Ear: External ear normal.     Left Ear: External ear normal.  Eyes:     Conjunctiva/sclera: Conjunctivae normal.  Pulmonary:     Effort: Pulmonary effort is normal.  Musculoskeletal:     Cervical back: Neck supple.  Skin:  General: Skin is warm and dry.  Neurological:     Mental Status: He is alert and oriented to person, place, and time.     Gait: Gait normal.  Psychiatric:        Mood and Affect: Mood normal.        Behavior: Behavior normal.        Thought Content: Thought content normal.        Judgment: Judgment normal.    UC Treatments / Results  Labs (all labs ordered are listed, but only abnormal results are displayed) Labs Reviewed - No data to display  EKG   Radiology No results found. Radiology reading report is negative.  Procedures Procedures (including critical care time)  Medications Ordered in UC Medications - No data to display  Initial Impression / Assessment and Plan / UC Course  I have reviewed the triage vital signs and the nursing notes. We do not have  A manual large cuff to recheck his BP properly.  L great toe contusion. He declined rx for pain meds.  Pertinent  imaging results that were available during my care of the patient were reviewed by me and considered in my medical decision making (see chart for  details). He will keep his toes buddy taped and FU with ortho.    Final Clinical Impressions(s) / UC Diagnoses   Final diagnoses:  Contusion of right great toe without damage to nail, initial encounter     Discharge Instructions     Take Tylenol 1000 mg every 6 hour for pain. Elevate and ice your toe for 20 minutes every 2-3 hours for 48 h.     ED Prescriptions    None     PDMP not reviewed this encounter.   Garey Ham, Cordelia Poche 06/10/20 2006

## 2020-06-10 NOTE — ED Triage Notes (Signed)
Dropped a 10 lb weight on right great toe.

## 2020-06-10 NOTE — Discharge Instructions (Addendum)
Take Tylenol 1000 mg every 6 hour for pain. Elevate and ice your toe for 20 minutes every 2-3 hours for 48 h.

## 2020-07-02 ENCOUNTER — Other Ambulatory Visit: Payer: Self-pay | Admitting: Family Medicine

## 2020-07-02 NOTE — Telephone Encounter (Signed)
Needs visit then send back to nurses for refill. Last refill dr Ladona Ridgel sent in she said needs visit

## 2020-07-05 NOTE — Telephone Encounter (Signed)
Sent my chart message to schedule appointment.

## 2020-07-14 NOTE — Telephone Encounter (Signed)
Appointment on 4/1

## 2020-07-16 ENCOUNTER — Other Ambulatory Visit: Payer: Self-pay

## 2020-07-16 ENCOUNTER — Encounter: Payer: Self-pay | Admitting: Family Medicine

## 2020-07-16 ENCOUNTER — Ambulatory Visit (INDEPENDENT_AMBULATORY_CARE_PROVIDER_SITE_OTHER): Payer: Managed Care, Other (non HMO) | Admitting: Family Medicine

## 2020-07-16 VITALS — HR 94 | Temp 97.9°F | Wt 313.2 lb

## 2020-07-16 DIAGNOSIS — F339 Major depressive disorder, recurrent, unspecified: Secondary | ICD-10-CM

## 2020-07-16 DIAGNOSIS — Z131 Encounter for screening for diabetes mellitus: Secondary | ICD-10-CM

## 2020-07-16 DIAGNOSIS — Z13220 Encounter for screening for lipoid disorders: Secondary | ICD-10-CM | POA: Diagnosis not present

## 2020-07-16 DIAGNOSIS — R319 Hematuria, unspecified: Secondary | ICD-10-CM

## 2020-07-16 LAB — POCT URINALYSIS DIPSTICK: Protein, UA: POSITIVE — AB

## 2020-07-16 MED ORDER — NITROFURANTOIN MONOHYD MACRO 100 MG PO CAPS: 100.0000 mg | ORAL_CAPSULE | Freq: Two times a day (BID) | ORAL | 0 refills | Status: DC

## 2020-07-16 MED ORDER — ESCITALOPRAM OXALATE 20 MG PO TABS: 20.0000 mg | ORAL_TABLET | Freq: Every day | ORAL | 1 refills | Status: DC

## 2020-07-16 NOTE — Progress Notes (Signed)
Pt here for follow up on Lexapro 20 mg once a day. Doing well.  Pt seen bright red blood in urine for past 2 days. No pain, no burning.     Patient ID: Curtis Robertson, male    DOB: Sep 18, 1970, 50 y.o.   MRN: [id number]   Chief Complaint  Patient presents with  . Depression   Subjective:  CC: medication management for depression and saw blood in urine  Presents today for medication management for depression.  Reports that all symptoms are well controlled, compliant with medication, no issues.  Also reports that in the last 2 days he saw blood in his urine twice.  Felt urinary urgency times once.  Denies fever, chills, chest pain, shortness of breath, abdominal pain.    Medical History Haaris has a past medical history of Arthritis.   Outpatient Encounter Medications as of 07/16/2020  Medication Sig  . MOBIC 15 MG tablet take 1 tablet daily with food for 10-14 days and then as needed after that.  . nitrofurantoin, macrocrystal-monohydrate, (MACROBID) 100 MG capsule Take 1 capsule (100 mg total) by mouth 2 (two) times daily.  . [DISCONTINUED] escitalopram (LEXAPRO) 20 MG tablet TAKE 1 TABLET BY MOUTH EVERY DAY  . escitalopram (LEXAPRO) 20 MG tablet Take 1 tablet (20 mg total) by mouth daily.  . [DISCONTINUED] predniSONE (DELTASONE) 10 MG tablet Take 2 tablets (20 mg total) by mouth daily.  . [DISCONTINUED] promethazine-dextromethorphan (PROMETHAZINE-DM) 6.25-15 MG/5ML syrup Take 5 mLs by mouth 4 (four) times daily as needed.   No facility-administered encounter medications on file as of 07/16/2020.     Review of Systems  Constitutional: Negative for chills and fever.  Respiratory: Negative for shortness of breath.   Cardiovascular: Negative for chest pain.  Gastrointestinal: Negative for abdominal pain.  Endocrine: Negative for polydipsia and polyuria.  Genitourinary: Positive for hematuria and urgency. Negative for dysuria, flank pain and frequency.     Vitals Pulse 94   Temp  97.9 F (36.6 C)   Wt (!) 313 lb 3.2 oz (142.1 kg)   SpO2 96%   BMI 42.48 kg/m   Objective:   Physical Exam Vitals reviewed.  Cardiovascular:     Rate and Rhythm: Normal rate and regular rhythm.     Heart sounds: Normal heart sounds.  Pulmonary:     Effort: Pulmonary effort is normal.     Breath sounds: Normal breath sounds.  Abdominal:     Tenderness: There is no right CVA tenderness or left CVA tenderness.  Skin:    General: Skin is warm and dry.  Neurological:     General: No focal deficit present.     Mental Status: He is alert.  Psychiatric:        Behavior: Behavior normal.     Results for orders placed or performed in visit on 07/16/20  POCT Urinalysis Dipstick  Result Value Ref Range   Color, UA     Clarity, UA     Glucose, UA     Bilirubin, UA     Ketones, UA +    Spec Grav, UA     Blood, UA +    pH, UA     Protein, UA Positive (A) Negative   Urobilinogen, UA     Nitrite, UA     Leukocytes, UA     Appearance     Odor       Assessment and Plan   1. Hematuria, unspecified type - POCT Urinalysis Dipstick - Comprehensive  Metabolic Panel (CMET) - PSA - nitrofurantoin, macrocrystal-monohydrate, (MACROBID) 100 MG capsule; Take 1 capsule (100 mg total) by mouth 2 (two) times daily.  Dispense: 20 capsule; Refill: 0 - Urine Culture  2. Depression, recurrent (HCC) - escitalopram (LEXAPRO) 20 MG tablet; Take 1 tablet (20 mg total) by mouth daily.  Dispense: 90 tablet; Refill: 1  3. Screening for cholesterol level - Lipid Profile  4. Screening for diabetes mellitus - HgB A1c    Depression: Medication compliance: taking as prescribed Symptoms well-controlled: well-controled,  no thoughts of self-harm/suidcide. PHQ-9: 1 Gad-7: n/a Thoughts of self-harm, suicidal thoughts: none Sleep issues: sleeps well Continue with current regimen: continue current dose. Refills sent. Follow-up in 6 months for depression.   Hematuria: We will send urine for  culture, due to hematuria and sense of urgency, will treat for urinary tract infection with antibiotic for 10 days.  Patient does not have a history of UTI, no history of prostate issues and has never been a smoker.   Agrees with plan of care discussed today. Understands warning signs to seek further care: chest pain, shortness of breath, any significant change in health.  Understands to follow-up in 2 weeks for annual exam and follow-up on hematuria.  Understands the importance of follow-up for hematuria, if hematuria still present after completes antibiotics, will send to urology for further evaluation/treatment.  Has not had lab work in a while, due for annual exam, will get labs today and schedule for annual exam.  Will notify any abnormal results of the labs once they become available, otherwise, review in detail at annual wellness.    Novella Olive, NP 07/16/2020

## 2020-07-16 NOTE — Patient Instructions (Signed)

## 2020-07-18 LAB — URINE CULTURE

## 2020-07-26 ENCOUNTER — Telehealth: Payer: Self-pay | Admitting: Family Medicine

## 2020-07-26 NOTE — Telephone Encounter (Signed)
Labs- showing slight elevation in glucose at 116 and a1c elevated at 5.9.  Showing pre-diabetes.   All other labs normal.  Need to decrease carbs in the diet and eat less white breads, pastas, rices, potatoes. Avoid sodas and sweet drinks.   Increase in exercising.  To help avoid needing to start a diabetic medication.   F/u in 45mo for recheck of labs.   Thx,   Dr. Ladona Ridgel

## 2020-07-27 NOTE — Telephone Encounter (Signed)
Telephone call-voicemail not set up ?

## 2020-07-28 NOTE — Telephone Encounter (Signed)
Tried to call no answer

## 2020-08-02 NOTE — Telephone Encounter (Signed)
Pt contacted and verbalized understanding.  

## 2020-10-27 ENCOUNTER — Other Ambulatory Visit: Payer: Self-pay | Admitting: Orthopedic Surgery

## 2020-10-27 ENCOUNTER — Other Ambulatory Visit (HOSPITAL_COMMUNITY): Payer: Self-pay | Admitting: Orthopedic Surgery

## 2020-10-27 DIAGNOSIS — M25562 Pain in left knee: Secondary | ICD-10-CM

## 2020-11-01 ENCOUNTER — Ambulatory Visit (HOSPITAL_COMMUNITY)
Admission: RE | Admit: 2020-11-01 | Discharge: 2020-11-01 | Disposition: A | Discharge status: Home / Self Care | Payer: Commercial Managed Care - PPO | Source: Ambulatory Visit | Attending: Orthopedic Surgery | Admitting: Orthopedic Surgery

## 2020-11-01 DIAGNOSIS — M25562 Pain in left knee: Secondary | ICD-10-CM | POA: Diagnosis not present

## 2021-02-16 ENCOUNTER — Telehealth: Payer: Self-pay | Admitting: Family Medicine

## 2021-02-16 NOTE — Telephone Encounter (Signed)
Patient has appointment 11/16 with Dr. Adriana Simas. Is out of Lexapro and asked for refill until appointment. Please advise.  [facility].  CB#  [phone]

## 2021-03-02 ENCOUNTER — Other Ambulatory Visit: Payer: Self-pay

## 2021-03-02 ENCOUNTER — Encounter: Payer: Self-pay | Admitting: Family Medicine

## 2021-03-02 ENCOUNTER — Ambulatory Visit (INDEPENDENT_AMBULATORY_CARE_PROVIDER_SITE_OTHER): Payer: Commercial Managed Care - PPO | Admitting: Family Medicine

## 2021-03-02 DIAGNOSIS — Z13220 Encounter for screening for lipoid disorders: Secondary | ICD-10-CM | POA: Diagnosis not present

## 2021-03-02 DIAGNOSIS — Z130 Encounter for screening for diseases of the blood and blood-forming organs and certain disorders involving the immune mechanism: Secondary | ICD-10-CM

## 2021-03-02 DIAGNOSIS — R7303 Prediabetes: Secondary | ICD-10-CM

## 2021-03-02 DIAGNOSIS — J3489 Other specified disorders of nose and nasal sinuses: Secondary | ICD-10-CM | POA: Insufficient documentation

## 2021-03-02 DIAGNOSIS — F339 Major depressive disorder, recurrent, unspecified: Secondary | ICD-10-CM | POA: Diagnosis not present

## 2021-03-02 DIAGNOSIS — R030 Elevated blood-pressure reading, without diagnosis of hypertension: Secondary | ICD-10-CM | POA: Diagnosis not present

## 2021-03-02 MED ORDER — TIRZEPATIDE 2.5 MG/0.5ML ~~LOC~~ SOAJ: 2.5000 mg | SUBCUTANEOUS | 0 refills | Status: DC

## 2021-03-02 MED ORDER — TIRZEPATIDE 5 MG/0.5ML ~~LOC~~ SOAJ: 5.0000 mg | SUBCUTANEOUS | 0 refills | Status: DC

## 2021-03-02 MED ORDER — ESCITALOPRAM OXALATE 20 MG PO TABS: 20.0000 mg | ORAL_TABLET | Freq: Every day | ORAL | 1 refills | Status: DC

## 2021-03-02 MED ORDER — DOXYCYCLINE HYCLATE 100 MG PO TABS: 100.0000 mg | ORAL_TABLET | Freq: Two times a day (BID) | ORAL | 0 refills | Status: AC

## 2021-03-02 MED ORDER — MUPIROCIN 2 % EX OINT: 1.0000 "application " | TOPICAL_OINTMENT | Freq: Three times a day (TID) | CUTANEOUS | 0 refills | Status: AC

## 2021-03-02 NOTE — Progress Notes (Signed)
Subjective:  Patient ID: Curtis Robertson, male    DOB: 24-Oct-1970  Age: 50 y.o. MRN: [id number]  CC: Chief Complaint  Patient presents with   Depression    Pt here for med refill. Taking Lexapro 20 mg daily Spot on nose; weight loss shot (pt borderline diabetic)    HPI:  50 year old male with depression and obesity presents for follow-up.  Depression is well controlled on Lexapro.  He is in need of a refill today.  Patient's blood pressure markedly elevated today.  He has a history of elevated BP but no diagnosis of hypertension.  No medication at this time.  Patient reports that for the past 3 weeks he has had a raised, red area on the bridge of his nose.  He has tried topical steroids without resolution.  Concern for possible infection.  No fever.  No known inciting factor.  No other associated symptoms.  He would like this examined today.  Additionally, patient states that he is interested in weight loss medication.  He is interested in one of the new injectable medications.  He states that he knows he needs to lose weight for his health.  He tries to stay active but states that he has put on weight and is having difficulty getting it off.  He would like to discuss weight loss medication today.   Patient Active Problem List   Diagnosis Date Noted   Elevated BP without diagnosis of hypertension 03/02/2021   Morbid obesity ([place]) 03/02/2021   Lesion of nose 03/02/2021   Depression, recurrent ([place]) 01/09/2013    Social Hx   Social History   Socioeconomic History   Marital status: Legally Separated    Spouse name: Not on file   Number of children: Not on file   Years of education: Not on file   Highest education level: Not on file  Occupational History   Not on file  Tobacco Use   Smoking status: Never   Smokeless tobacco: Never  Substance and Sexual Activity   Alcohol use: Not Currently   Drug use: Never   Sexual activity: Not on file  Other Topics Concern   Not on  file  Social History Narrative   Not on file   Social Determinants of Health   Financial Resource Strain: Not on file  Food Insecurity: Not on file  Transportation Needs: Not on file  Physical Activity: Not on file  Stress: Not on file  Social Connections: Not on file    Review of Systems Per HPI  Objective:  BP (!) 170/98   Pulse (!) 118   Temp 99.1 F (37.3 C)   Ht 6' (1.829 m)   Wt (!) 307 lb 6.4 oz (139.4 kg)   SpO2 96%   BMI 41.69 kg/m   BP/Weight 03/02/2021 07/16/2020 3/78/5885  Systolic BP 027 - 741  Diastolic BP 98 - 96  Wt. (Lbs) 307.4 313.2 -  BMI 41.69 42.48 -    Physical Exam Constitutional:      Appearance: Normal appearance. He is obese.  HENT:     Head: Normocephalic and atraumatic.     Nose:      Comments: Raised, erythematous area.  No fluctuance. Eyes:     General:        Right eye: No discharge.        Left eye: No discharge.     Conjunctiva/sclera: Conjunctivae normal.  Cardiovascular:     Rate and Rhythm: Regular rhythm. Tachycardia present.  Pulmonary:     Effort: Pulmonary effort is normal.     Breath sounds: Normal breath sounds. No wheezing, rhonchi or rales.  Neurological:     Mental Status: He is alert.    Lab Results  Component Value Date   GLUCOSE 107 (H) 07/15/2016   CHOL 152 07/15/2016   TRIG 93 07/15/2016   HDL 38 (L) 07/15/2016   LDLCALC 95 07/15/2016   ALT 29 07/15/2016   AST 27 07/15/2016   NA 143 07/15/2016   K 4.3 07/15/2016   CL 102 07/15/2016   CREATININE 1.02 07/15/2016   BUN 11 07/15/2016   CO2 26 07/15/2016     Assessment & Plan:   Problem List Items Addressed This Visit       Other   Morbid obesity ([place])    We will start Moujaro (hopefully can get this approved).  Patient prefers once weekly injection.  Does not want to do Saxenda as a result.  Mancel Parsons is on backorder.      Relevant Medications   tirzepatide (MOUNJARO) 2.5 MG/0.5ML Pen   tirzepatide (MOUNJARO) 5 MG/0.5ML Pen   Lesion of  nose    Concern for possible infection.  Placing on doxycycline and Bactroban ointment.      Elevated BP without diagnosis of hypertension    Patient's BP markedly elevated today and on multiple rechecks in the office.  Advised to check at home.  Patient will follow-up in 2 weeks for blood pressure check.      Relevant Orders   CMP14+EGFR   Depression, recurrent ([place])    Stable.  Continue Lexapro.      Relevant Medications   escitalopram (LEXAPRO) 20 MG tablet   Other Visit Diagnoses     Prediabetes       Relevant Orders   Hemoglobin A1c   Screening, lipid       Relevant Orders   Lipid panel   Screening for deficiency anemia       Relevant Orders   CBC       Meds ordered this encounter  Medications   escitalopram (LEXAPRO) 20 MG tablet    Sig: Take 1 tablet (20 mg total) by mouth daily.    Dispense:  90 tablet    Refill:  1   doxycycline (VIBRA-TABS) 100 MG tablet    Sig: Take 1 tablet (100 mg total) by mouth 2 (two) times daily for 7 days.    Dispense:  14 tablet    Refill:  0   mupirocin ointment (BACTROBAN) 2 %    Sig: Apply 1 application topically 3 (three) times daily for 7 days.    Dispense:  30 g    Refill:  0   tirzepatide (MOUNJARO) 2.5 MG/0.5ML Pen    Sig: Inject 2.5 mg into the skin once a week.    Dispense:  2 mL    Refill:  0   tirzepatide (MOUNJARO) 5 MG/0.5ML Pen    Sig: Inject 5 mg into the skin once a week.    Dispense:  6 mL    Refill:  0    Follow-up:  Return in about 2 weeks (around 03/16/2021) for BP check - Nurse visit.  [place]

## 2021-03-02 NOTE — Assessment & Plan Note (Signed)
We will start Curtis Robertson (hopefully can get this approved).  Patient prefers once weekly injection.  Does not want to do Saxenda as a result.  Reginal Lutes is on backorder.

## 2021-03-02 NOTE — Patient Instructions (Signed)
I will see if I can get you started on weight loss medication (pending insurance).  Medication as prescribed.  Return in 2 weeks for repeat BP check. Check BP at home if you can.  Take care  Dr. Adriana Simas

## 2021-03-02 NOTE — Assessment & Plan Note (Signed)
Concern for possible infection.  Placing on doxycycline and Bactroban ointment.

## 2021-03-02 NOTE — Assessment & Plan Note (Signed)
Patient's BP markedly elevated today and on multiple rechecks in the office.  Advised to check at home.  Patient will follow-up in 2 weeks for blood pressure check.

## 2021-03-02 NOTE — Assessment & Plan Note (Signed)
Stable.  Continue Lexapro. 

## 2021-03-08 ENCOUNTER — Telehealth: Payer: Self-pay | Admitting: Family Medicine

## 2021-03-08 NOTE — Telephone Encounter (Signed)
Patient will keep appointment for tomorrow

## 2021-03-08 NOTE — Telephone Encounter (Signed)
Patient has an appointment tomorrow at 11:20 but cannot get labs done until 4:30 tomorrow afternoon. Wanted to know if that would be ok or does he need to reschedule. Please advise.   CB#:  [phone]

## 2021-03-09 ENCOUNTER — Ambulatory Visit (INDEPENDENT_AMBULATORY_CARE_PROVIDER_SITE_OTHER): Payer: Commercial Managed Care - PPO | Admitting: Family Medicine

## 2021-03-09 ENCOUNTER — Encounter: Payer: Self-pay | Admitting: Family Medicine

## 2021-03-09 ENCOUNTER — Other Ambulatory Visit: Payer: Self-pay

## 2021-03-09 VITALS — BP 142/92 | HR 90 | Temp 97.3°F | Ht 72.0 in | Wt 305.0 lb

## 2021-03-09 DIAGNOSIS — J3489 Other specified disorders of nose and nasal sinuses: Secondary | ICD-10-CM | POA: Diagnosis not present

## 2021-03-09 DIAGNOSIS — I10 Essential (primary) hypertension: Secondary | ICD-10-CM | POA: Diagnosis not present

## 2021-03-09 MED ORDER — LOSARTAN POTASSIUM 50 MG PO TABS: 50.0000 mg | ORAL_TABLET | Freq: Every day | ORAL | 1 refills | Status: DC

## 2021-03-09 NOTE — Assessment & Plan Note (Signed)
No improvement with antibiotic therapy.  Referring to dermatology.

## 2021-03-09 NOTE — Patient Instructions (Signed)
Medication as prescribed.  Referral placed to dermatology.  BP check in 2 weeks (lab at that time as well).

## 2021-03-09 NOTE — Assessment & Plan Note (Signed)
Starting pharmacotherapy.  Losartan as prescribed.  Has upcoming labs.  Follow-up in 2 weeks for blood pressure check.

## 2021-03-09 NOTE — Progress Notes (Signed)
Subjective:  Patient ID: Curtis Robertson, male    DOB: [birth year]  Age: 50 y.o. MRN: [id number]  CC: Chief Complaint  Patient presents with   Hypertension    Some blurred vision/headaches (HA not bad); no chest pain or dizziness. Pt states BP was high when checked at home.  Bump on nose area is not going away    HPI:  50 year old male presents for follow-up regarding elevated blood pressure.  Patient blood pressure continues to be elevated.  BP 142/92 today.  Elevated at home as well.  Will discuss initially pharmacotherapy today.  Additionally, patient states that the lesion on his nose is still present.  Has not improved with recently prescribed Bactroban and doxycycline.  Patient Active Problem List   Diagnosis Date Noted   Essential hypertension 03/09/2021   Morbid obesity (HCC) 03/02/2021   Lesion of nose 03/02/2021   Depression, recurrent (HCC) 01/09/2013    Social Hx   Social History   Socioeconomic History   Marital status: Legally Separated    Spouse name: Not on file   Number of children: Not on file   Years of education: Not on file   Highest education level: Not on file  Occupational History   Not on file  Tobacco Use   Smoking status: Never   Smokeless tobacco: Never  Substance and Sexual Activity   Alcohol use: Not Currently   Drug use: Never   Sexual activity: Not on file  Other Topics Concern   Not on file  Social History Narrative   Not on file   Social Determinants of Health   Financial Resource Strain: Not on file  Food Insecurity: Not on file  Transportation Needs: Not on file  Physical Activity: Not on file  Stress: Not on file  Social Connections: Not on file    Review of Systems  Constitutional: Negative.   Skin:        Lesion on nose is still present.    Objective:  BP (!) 142/92   Pulse 90   Temp (!) 97.3 F (36.3 C)   Ht 6' (1.829 m)   Wt (!) 305 lb (138.3 kg)   SpO2 99%   BMI 41.37 kg/m   BP/Weight 03/09/2021  03/02/2021 07/16/2020  Systolic BP 142 170 -  Diastolic BP 92 98 -  Wt. (Lbs) 305 307.4 313.2  BMI 41.37 41.69 42.48    Physical Exam Constitutional:      Appearance: Normal appearance. He is obese.  HENT:     Nose:     Comments: Raised circular lesion noted on the bridge of the nose. Cardiovascular:     Rate and Rhythm: Normal rate and regular rhythm.  Pulmonary:     Effort: Pulmonary effort is normal.     Breath sounds: Normal breath sounds. No wheezing, rhonchi or rales.  Neurological:     Mental Status: He is alert.    Lab Results  Component Value Date   GLUCOSE 107 (H) 07/15/2016   CHOL 152 07/15/2016   TRIG 93 07/15/2016   HDL 38 (L) 07/15/2016   LDLCALC 95 07/15/2016   ALT 29 07/15/2016   AST 27 07/15/2016   NA 143 07/15/2016   K 4.3 07/15/2016   CL 102 07/15/2016   CREATININE 1.02 07/15/2016   BUN 11 07/15/2016   CO2 26 07/15/2016     Assessment & Plan:   Problem List Items Addressed This Visit       Cardiovascular and Mediastinum  Essential hypertension    Starting pharmacotherapy.  Losartan as prescribed.  Has upcoming labs.  Follow-up in 2 weeks for blood pressure check.      Relevant Medications   losartan (COZAAR) 50 MG tablet     Other   Lesion of nose - Primary    No improvement with antibiotic therapy.  Referring to dermatology.      Relevant Orders   Ambulatory referral to Dermatology    Meds ordered this encounter  Medications   losartan (COZAAR) 50 MG tablet    Sig: Take 1 tablet (50 mg total) by mouth daily.    Dispense:  90 tablet    Refill:  1    Follow-up:  Return in about 2 weeks (around 03/23/2021) for BP check - Nurse visit.  Everlene Other DO [facility]

## 2021-03-12 LAB — LIPID PANEL
Chol/HDL Ratio: 4.4 ratio (ref 0.0–5.0)
Cholesterol, Total: 174 mg/dL (ref 100–199)
HDL: 40 mg/dL (ref >39)
LDL Chol Calc (NIH): 106 mg/dL — ABNORMAL HIGH (ref 0–99)
Triglycerides: 157 mg/dL — ABNORMAL HIGH (ref 0–149)
VLDL Cholesterol Cal: 28 mg/dL (ref 5–40)

## 2021-03-12 LAB — CMP14+EGFR
ALT: 28 IU/L (ref 0–44)
AST: 29 IU/L (ref 0–40)
Albumin/Globulin Ratio: 1.9 (ref 1.2–2.2)
Albumin: 4.6 g/dL (ref 4.0–5.0)
Alkaline Phosphatase: 77 IU/L (ref 44–121)
BUN/Creatinine Ratio: 15 (ref 9–20)
BUN: 16 mg/dL (ref 6–24)
Bilirubin Total: 0.3 mg/dL (ref 0.0–1.2)
CO2: 24 mmol/L (ref 20–29)
Calcium: 9.3 mg/dL (ref 8.7–10.2)
Chloride: 103 mmol/L (ref 96–106)
Creatinine, Ser: 1.04 mg/dL (ref 0.76–1.27)
Globulin, Total: 2.4 g/dL (ref 1.5–4.5)
Glucose: 94 mg/dL (ref 70–99)
Potassium: 4.7 mmol/L (ref 3.5–5.2)
Sodium: 142 mmol/L (ref 134–144)
Total Protein: 7 g/dL (ref 6.0–8.5)
eGFR: 87 mL/min/{1.73_m2} (ref >59)

## 2021-03-12 LAB — CBC
Hematocrit: 43.8 % (ref 37.5–51.0)
Hemoglobin: 14.2 g/dL (ref 13.0–17.7)
MCH: 28.1 pg (ref 26.6–33.0)
MCHC: 32.4 g/dL (ref 31.5–35.7)
MCV: 87 fL (ref 79–97)
Platelets: 224 10*3/uL (ref 150–450)
RBC: 5.06 x10E6/uL (ref 4.14–5.80)
RDW: 12.7 % (ref 11.6–15.4)
WBC: 5.8 10*3/uL (ref 3.4–10.8)

## 2021-03-12 LAB — HEMOGLOBIN A1C
Est. average glucose Bld gHb Est-mCnc: 120 mg/dL
Hgb A1c MFr Bld: 5.8 % — ABNORMAL HIGH (ref 4.8–5.6)

## 2021-03-16 ENCOUNTER — Telehealth: Payer: Self-pay | Admitting: Family Medicine

## 2021-03-23 ENCOUNTER — Other Ambulatory Visit: Payer: Commercial Managed Care - PPO

## 2021-03-23 ENCOUNTER — Other Ambulatory Visit: Payer: Self-pay

## 2021-04-08 ENCOUNTER — Other Ambulatory Visit: Payer: Self-pay | Admitting: *Deleted

## 2021-04-08 MED ORDER — TIRZEPATIDE 5 MG/0.5ML ~~LOC~~ SOAJ: 5.0000 mg | SUBCUTANEOUS | 0 refills | Status: DC

## 2021-06-29 ENCOUNTER — Telehealth: Payer: Self-pay | Admitting: Family Medicine

## 2021-06-29 MED ORDER — TIRZEPATIDE 7.5 MG/0.5ML ~~LOC~~ SOAJ: SUBCUTANEOUS | 0 refills | Status: DC

## 2021-06-29 NOTE — Telephone Encounter (Signed)
Refill sent in and pt is aware 

## 2021-06-29 NOTE — Addendum Note (Signed)
Addended by: Marlowe Shores on: 06/29/2021 11:57 AM ? ? Modules accepted: Orders ? ?

## 2021-06-29 NOTE — Telephone Encounter (Signed)
Pt requesting refll of Mounjaro. Has been on 5 mg and would like to move up to 7.5 mg. Next shot due on Saturday. Please advise. Thank you ? ?[facility].  ?

## 2021-07-25 ENCOUNTER — Other Ambulatory Visit: Payer: Self-pay | Admitting: Family Medicine

## 2021-07-25 ENCOUNTER — Telehealth: Payer: Self-pay | Admitting: *Deleted

## 2021-07-25 MED ORDER — TIRZEPATIDE 10 MG/0.5ML ~~LOC~~ SOAJ: 10.0000 mg | SUBCUTANEOUS | 0 refills | Status: DC

## 2021-07-25 NOTE — Telephone Encounter (Signed)
Cook, Jayce G, DO   ? ?Rx sent.   ? ?

## 2021-07-25 NOTE — Telephone Encounter (Signed)
Pt contacted and verbalized understanding.  

## 2021-07-25 NOTE — Telephone Encounter (Signed)
Patient requesting a new script to increase Mounjaro to 10mg  weekly- Last seen 11/22 ? ?[facility] [place] ?

## 2021-08-24 ENCOUNTER — Telehealth: Payer: Self-pay | Admitting: Family Medicine

## 2021-08-24 NOTE — Telephone Encounter (Signed)
Pt calling in to request next dose of Mounjaro. Pt states he finished the 10mg /0.46ml. please advise. Thank you ? ?[facility].  ?

## 2021-08-25 ENCOUNTER — Other Ambulatory Visit: Payer: Self-pay | Admitting: Family Medicine

## 2021-08-25 MED ORDER — TIRZEPATIDE 15 MG/0.5ML ~~LOC~~ SOAJ: 15.0000 mg | SUBCUTANEOUS | 1 refills | Status: DC

## 2021-08-25 NOTE — Telephone Encounter (Signed)
Pt contacted and verbalized understanding.  

## 2021-09-19 ENCOUNTER — Other Ambulatory Visit: Payer: Self-pay | Admitting: *Deleted

## 2021-09-19 MED ORDER — LOSARTAN POTASSIUM 50 MG PO TABS: 50.0000 mg | ORAL_TABLET | Freq: Every day | ORAL | 0 refills | Status: DC

## 2021-09-23 ENCOUNTER — Ambulatory Visit: Payer: Self-pay | Admitting: Family Medicine

## 2021-10-13 ENCOUNTER — Other Ambulatory Visit: Payer: Self-pay | Admitting: Family Medicine

## 2021-11-10 ENCOUNTER — Other Ambulatory Visit: Payer: Self-pay | Admitting: Family Medicine

## 2021-11-10 MED ORDER — TIRZEPATIDE 15 MG/0.5ML ~~LOC~~ SOAJ: 15.0000 mg | SUBCUTANEOUS | 0 refills | Status: DC

## 2021-11-24 ENCOUNTER — Encounter: Payer: Self-pay | Admitting: Family Medicine

## 2021-11-24 ENCOUNTER — Ambulatory Visit (INDEPENDENT_AMBULATORY_CARE_PROVIDER_SITE_OTHER): Payer: Commercial Managed Care - PPO | Admitting: Family Medicine

## 2021-11-24 VITALS — BP 138/82 | HR 69 | Temp 97.7°F | Wt 274.2 lb

## 2021-11-24 DIAGNOSIS — R7303 Prediabetes: Secondary | ICD-10-CM | POA: Insufficient documentation

## 2021-11-24 DIAGNOSIS — E782 Mixed hyperlipidemia: Secondary | ICD-10-CM

## 2021-11-24 DIAGNOSIS — I10 Essential (primary) hypertension: Secondary | ICD-10-CM | POA: Diagnosis not present

## 2021-11-24 DIAGNOSIS — E669 Obesity, unspecified: Secondary | ICD-10-CM | POA: Insufficient documentation

## 2021-11-24 DIAGNOSIS — Z125 Encounter for screening for malignant neoplasm of prostate: Secondary | ICD-10-CM | POA: Diagnosis not present

## 2021-11-24 DIAGNOSIS — F339 Major depressive disorder, recurrent, unspecified: Secondary | ICD-10-CM

## 2021-11-24 NOTE — Assessment & Plan Note (Signed)
Patient's blood pressure is doing well at this time.  Continue losartan.  Patient has done well with weight loss.

## 2021-11-24 NOTE — Assessment & Plan Note (Signed)
Improving.  Weight down to 274 from 307.  Patient will continue Mounjaro.

## 2021-11-24 NOTE — Assessment & Plan Note (Addendum)
Stable on Lexapro.  Continue. 

## 2021-11-24 NOTE — Patient Instructions (Addendum)
Labs ordered.  Continue your medications.   Consider colonoscopy.  Follow up in 6 months.

## 2021-11-24 NOTE — Progress Notes (Signed)
Subjective:  Patient ID: Curtis Robertson, male    DOB: [birth year]  Age: 51 y.o. MRN: [id number]  CC: Chief Complaint  Patient presents with   Hypertension    Pt arrives for follow up on HTN. No issues at this time. Pt has been out of Mounjaro since Monday due to being on backorder     HPI:  51 year old male with hypertension and obesity presents for follow-up.  Patient is lost a significant amount of weight.  His first visit with me was in November and his weight was 307 pounds.  He is currently down to 274.  Hypertension has improved.  BP is currently well-controlled on losartan.  Overall he is doing well.  He states that he has Darcel Bayley is on backorder.  He is compliant with his losartan.  Depression is stable on Lexapro.  Patient Active Problem List   Diagnosis Date Noted   Obesity (BMI 30-39.9) 11/24/2021   Mixed hyperlipidemia 11/24/2021   Prediabetes 11/24/2021   Essential hypertension 03/09/2021   Depression, recurrent ([place]) 01/09/2013    Social Hx   Social History   Socioeconomic History   Marital status: Legally Separated    Spouse name: Not on file   Number of children: Not on file   Years of education: Not on file   Highest education level: Not on file  Occupational History   Not on file  Tobacco Use   Smoking status: Never   Smokeless tobacco: Never  Substance and Sexual Activity   Alcohol use: Not Currently   Drug use: Never   Sexual activity: Not on file  Other Topics Concern   Not on file  Social History Narrative   Not on file   Social Determinants of Health   Financial Resource Strain: Not on file  Food Insecurity: Not on file  Transportation Needs: Not on file  Physical Activity: Not on file  Stress: Not on file  Social Connections: Not on file    Review of Systems  Constitutional: Negative.   Respiratory: Negative.    Cardiovascular: Negative.    Objective:  BP 138/82   Pulse 69   Temp 97.7 F (36.5 C)   Wt 274 lb 3.2 oz (124.4  kg)   SpO2 97%   BMI 37.19 kg/m      11/24/2021   10:40 AM 03/23/2021   10:41 AM 03/09/2021   11:25 AM  BP/Weight  Systolic BP 789 381 017  Diastolic BP 82 80 92  Wt. (Lbs) 274.2  305  BMI 37.19 kg/m2  41.37 kg/m2    Physical Exam Vitals and nursing note reviewed.  Constitutional:      General: He is not in acute distress.    Appearance: Normal appearance. He is obese. He is not ill-appearing.  HENT:     Head: Normocephalic and atraumatic.  Cardiovascular:     Rate and Rhythm: Normal rate and regular rhythm.  Pulmonary:     Effort: Pulmonary effort is normal.     Breath sounds: Normal breath sounds. No wheezing, rhonchi or rales.  Abdominal:     General: There is no distension.     Palpations: Abdomen is soft.     Tenderness: There is no abdominal tenderness.  Neurological:     Mental Status: He is alert.  Psychiatric:        Mood and Affect: Mood normal.        Behavior: Behavior normal.     Lab Results  Component Value Date  WBC 5.8 03/11/2021   HGB 14.2 03/11/2021   HCT 43.8 03/11/2021   PLT 224 03/11/2021   GLUCOSE 94 03/11/2021   CHOL 174 03/11/2021   TRIG 157 (H) 03/11/2021   HDL 40 03/11/2021   LDLCALC 106 (H) 03/11/2021   ALT 28 03/11/2021   AST 29 03/11/2021   NA 142 03/11/2021   K 4.7 03/11/2021   CL 103 03/11/2021   CREATININE 1.04 03/11/2021   BUN 16 03/11/2021   CO2 24 03/11/2021   HGBA1C 5.8 (H) 03/11/2021     Assessment & Plan:   Problem List Items Addressed This Visit       Cardiovascular and Mediastinum   Essential hypertension - Primary    Patient's blood pressure is doing well at this time.  Continue losartan.  Patient has done well with weight loss.      Relevant Orders   CMP14+EGFR     Other   Depression, recurrent ([place])    Stable on Lexapro.  Continue.      Mixed hyperlipidemia   Relevant Orders   Lipid panel   Obesity (BMI 30-39.9)    Improving.  Weight down to 274 from 307.  Patient will continue Mounjaro.       Prediabetes   Relevant Orders   Hemoglobin A1c   Other Visit Diagnoses     Prostate cancer screening       Relevant Orders   PSA      Follow-up:  Return in about 6 months (around 05/27/2022).  [place]

## 2021-11-29 ENCOUNTER — Telehealth: Payer: Self-pay | Admitting: *Deleted

## 2021-11-29 MED ORDER — TIRZEPATIDE 12.5 MG/0.5ML ~~LOC~~ SOAJ: 12.5000 mg | SUBCUTANEOUS | 0 refills | Status: DC

## 2021-11-29 NOTE — Telephone Encounter (Signed)
Patietn stated the mounjaro 15 is on back order and he has been with out med for 3 weeks and would like to try to go back to 12.5 mg dose so he doesn't keep missing his med if possible   [facility]

## 2021-11-29 NOTE — Telephone Encounter (Signed)
Tommie Sams, DO     Will you sent it in for me please? Thank you   Dr. Adriana Simas

## 2021-11-29 NOTE — Telephone Encounter (Signed)
Prescription sent electronically to pharmacy. Patient notified. 

## 2021-12-04 ENCOUNTER — Other Ambulatory Visit: Payer: Self-pay | Admitting: Family Medicine

## 2022-03-15 ENCOUNTER — Other Ambulatory Visit: Payer: Self-pay | Admitting: Family Medicine

## 2022-03-26 ENCOUNTER — Other Ambulatory Visit: Payer: Self-pay | Admitting: Family Medicine

## 2022-03-28 IMAGING — DX DG FOOT COMPLETE 3+V*R*
3 series · 3 of 3 positions shown · non-contrast
Comparison: None.

CLINICAL DATA: Weight dropped on foot.  Pain.  Pain at great toe.

EXAM:
RIGHT FOOT COMPLETE - 3+ VIEW

[foot ap]
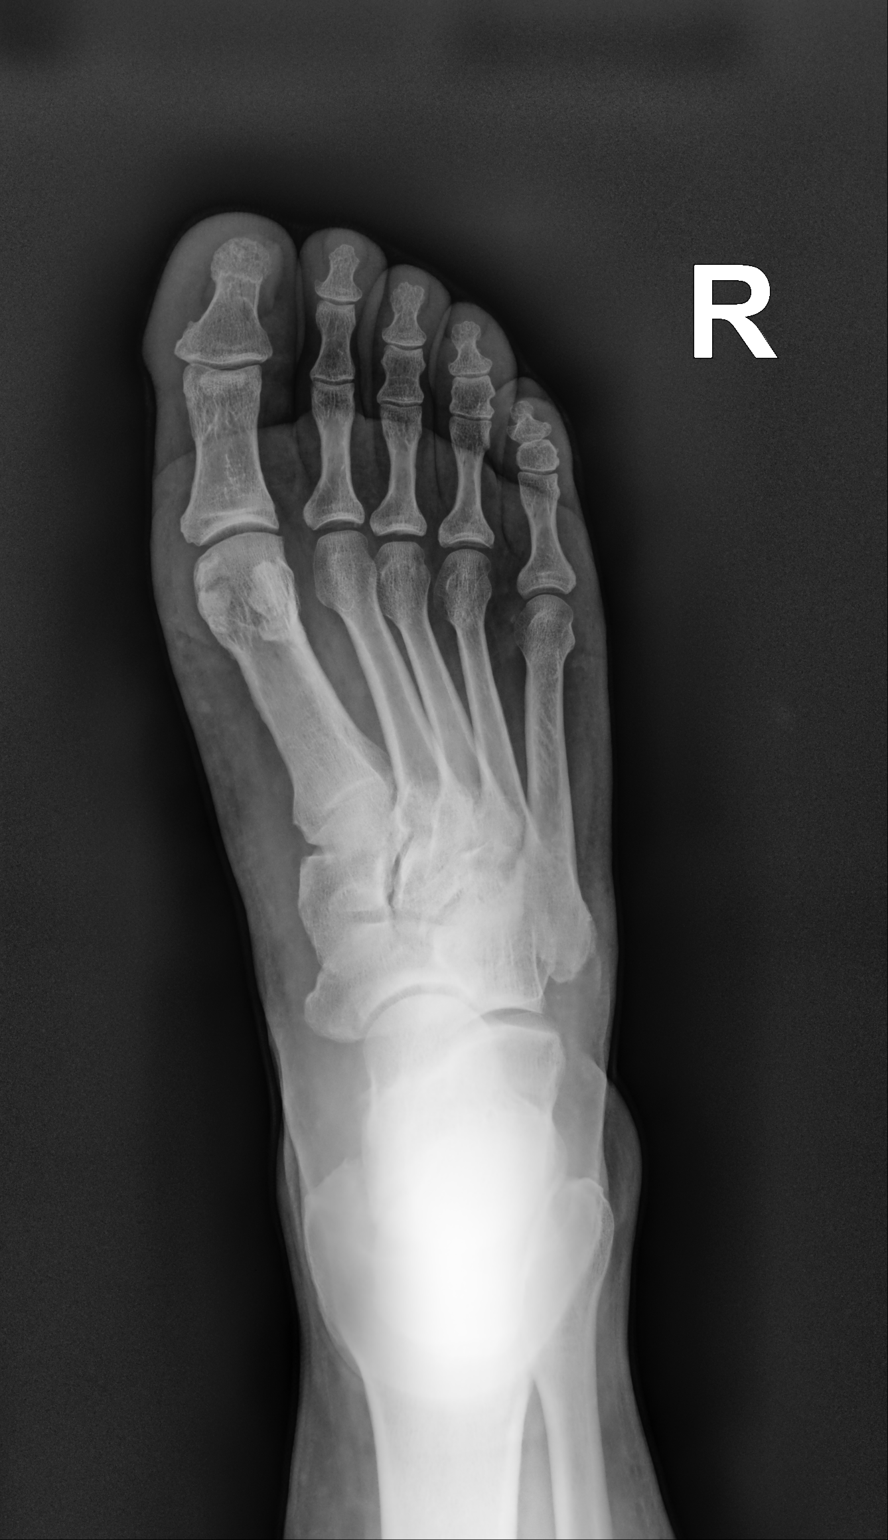

[foot mlo]
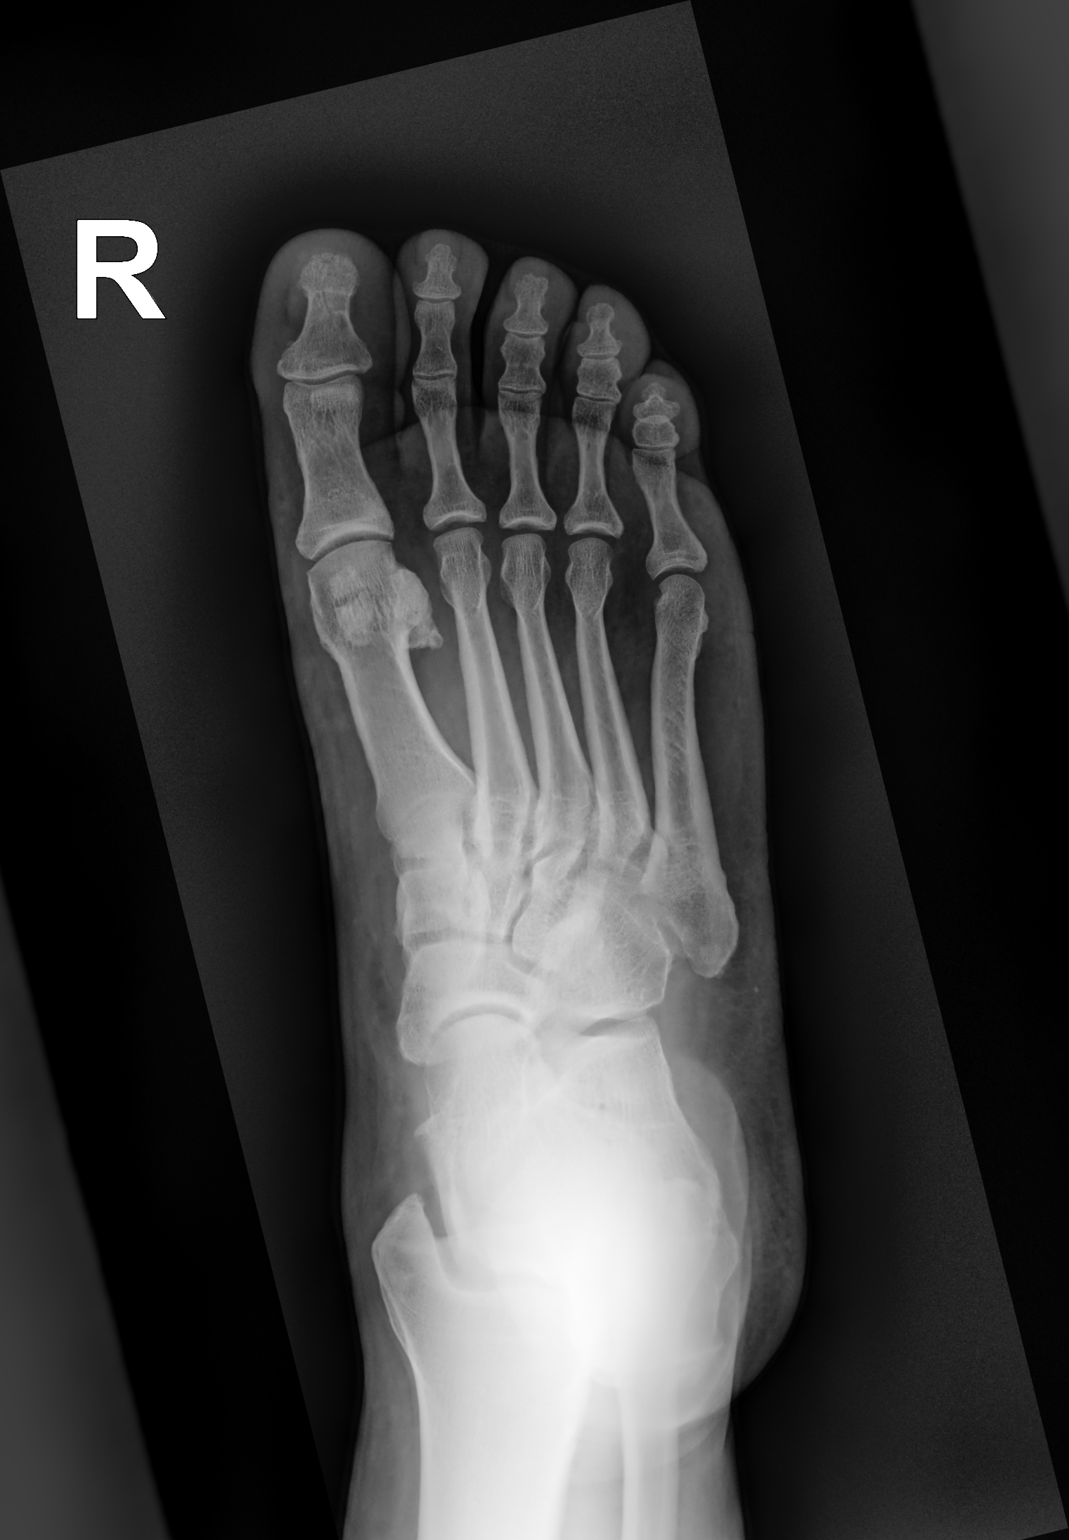

[foot lat]
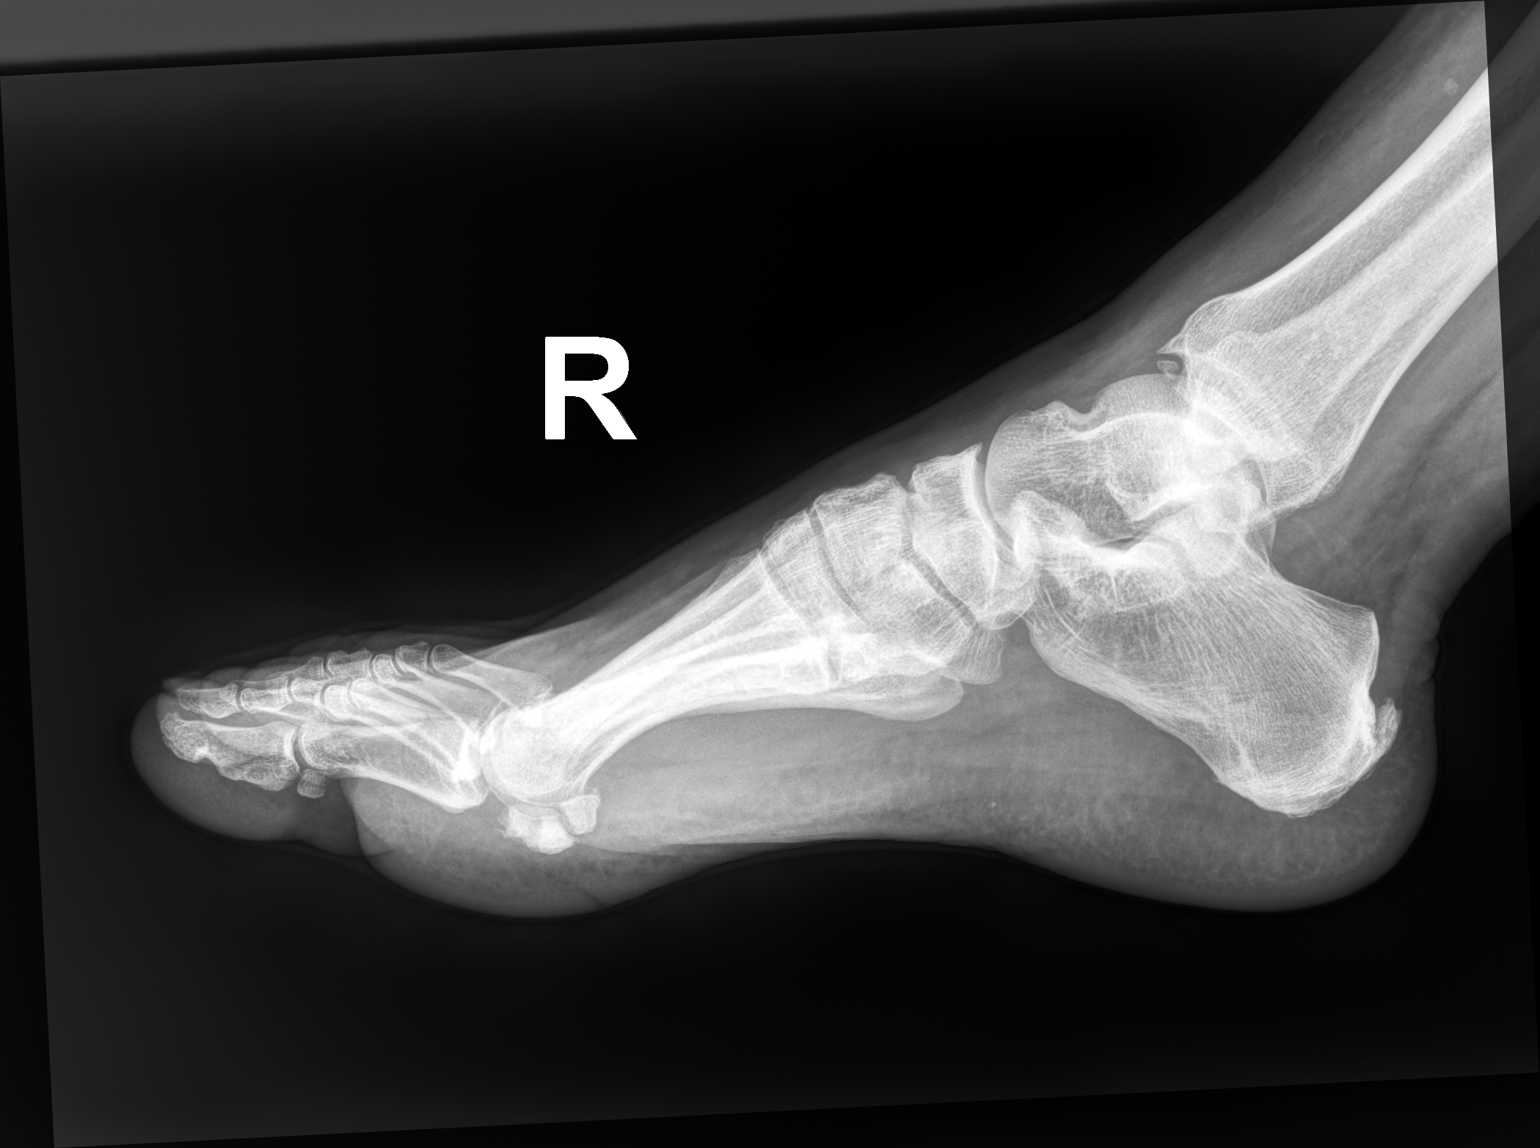

[3 of 3 positions shown; findings below may reference images not displayed]

FINDINGS: Small Achilles spur. No acute fracture or dislocation. Degenerative
irregularity of the anterior tibiotalar joint on the lateral view.
IMPRESSION: No acute osseous abnormality.

## 2022-04-05 ENCOUNTER — Other Ambulatory Visit: Payer: Self-pay | Admitting: Family Medicine

## 2022-05-26 ENCOUNTER — Encounter: Payer: Self-pay | Admitting: Family Medicine

## 2022-05-26 ENCOUNTER — Encounter: Payer: Self-pay | Admitting: *Deleted

## 2022-05-26 ENCOUNTER — Ambulatory Visit (INDEPENDENT_AMBULATORY_CARE_PROVIDER_SITE_OTHER): Payer: Commercial Managed Care - PPO | Admitting: Family Medicine

## 2022-05-26 DIAGNOSIS — E669 Obesity, unspecified: Secondary | ICD-10-CM | POA: Diagnosis not present

## 2022-05-26 DIAGNOSIS — R7303 Prediabetes: Secondary | ICD-10-CM | POA: Diagnosis not present

## 2022-05-26 DIAGNOSIS — Z1211 Encounter for screening for malignant neoplasm of colon: Secondary | ICD-10-CM

## 2022-05-26 DIAGNOSIS — F339 Major depressive disorder, recurrent, unspecified: Secondary | ICD-10-CM

## 2022-05-26 DIAGNOSIS — Z23 Encounter for immunization: Secondary | ICD-10-CM | POA: Diagnosis not present

## 2022-05-26 DIAGNOSIS — E782 Mixed hyperlipidemia: Secondary | ICD-10-CM | POA: Diagnosis not present

## 2022-05-26 DIAGNOSIS — I10 Essential (primary) hypertension: Secondary | ICD-10-CM | POA: Diagnosis not present

## 2022-05-26 MED ORDER — LOSARTAN POTASSIUM 100 MG PO TABS: 100.0000 mg | ORAL_TABLET | Freq: Every day | ORAL | 3 refills | Status: DC

## 2022-05-26 NOTE — Assessment & Plan Note (Signed)
Continue Mounjaro.

## 2022-05-26 NOTE — Progress Notes (Signed)
Subjective:  Patient ID: Curtis Robertson, male    DOB: 1971-02-13  Age: 52 y.o. MRN: [id number]  CC: Chief Complaint  Patient presents with   Hypertension   Prediabetes    ON MOUNJARO    HPI:  52 year old male with obesity, hyperlipidemia, depression, hypertension presents for follow-up.  In regards to health maintenance, patient declined hepatitis C and HIV screening today.  He is amenable to colonoscopy.  Will place referral.  Also, patient needs a tetanus.  He will get this today.  Blood pressure elevated and was still mildly elevated on repeat.  He is on losartan 50 mg daily.  Will discuss.  Patient continues on Mounjaro for weight loss.  He has had some success.  Depression stable on Lexapro.  Patient reports recent sinus trouble.  He had some runny nose.  Patient Active Problem List   Diagnosis Date Noted   Obesity (BMI 30-39.9) 11/24/2021   Mixed hyperlipidemia 11/24/2021   Prediabetes 11/24/2021   Essential hypertension 03/09/2021   Depression, recurrent ([place]) 01/09/2013    Social Hx   Social History   Socioeconomic History   Marital status: Legally Separated    Spouse name: Not on file   Number of children: Not on file   Years of education: Not on file   Highest education level: Not on file  Occupational History   Not on file  Tobacco Use   Smoking status: Never   Smokeless tobacco: Never  Substance and Sexual Activity   Alcohol use: Not Currently   Drug use: Never   Sexual activity: Not on file  Other Topics Concern   Not on file  Social History Narrative   Not on file   Social Determinants of Health   Financial Resource Strain: Not on file  Food Insecurity: Not on file  Transportation Needs: Not on file  Physical Activity: Not on file  Stress: Not on file  Social Connections: Not on file    Review of Systems  Constitutional: Negative.   HENT:  Positive for rhinorrhea.     Objective:  BP (!) 140/72   Temp 98.1 F (36.7 C)   Ht 6'  (1.829 m)   Wt 268 lb (121.6 kg)   BMI 36.35 kg/m      05/26/2022   10:20 AM 05/26/2022    9:56 AM 05/26/2022    9:50 AM  BP/Weight  Systolic BP [id number]  Diastolic BP 72 99 [id number]  Wt. (Lbs)   268  BMI   36.35 kg/m2    Physical Exam Vitals and nursing note reviewed.  Constitutional:      General: He is not in acute distress.    Appearance: Normal appearance. He is obese.  HENT:     Head: Normocephalic and atraumatic.  Eyes:     General:        Right eye: No discharge.        Left eye: No discharge.     Conjunctiva/sclera: Conjunctivae normal.  Cardiovascular:     Rate and Rhythm: Normal rate and regular rhythm.  Pulmonary:     Effort: Pulmonary effort is normal.     Breath sounds: Normal breath sounds. No wheezing, rhonchi or rales.  Neurological:     Mental Status: He is alert.  Psychiatric:        Mood and Affect: Mood normal.        Behavior: Behavior normal.     Lab Results  Component Value Date   WBC  5.8 03/11/2021   HGB 14.2 03/11/2021   HCT 43.8 03/11/2021   PLT 224 03/11/2021   GLUCOSE 94 03/11/2021   CHOL 174 03/11/2021   TRIG 157 (H) 03/11/2021   HDL 40 03/11/2021   LDLCALC 106 (H) 03/11/2021   ALT 28 03/11/2021   AST 29 03/11/2021   NA 142 03/11/2021   K 4.7 03/11/2021   CL 103 03/11/2021   CREATININE 1.04 03/11/2021   BUN 16 03/11/2021   CO2 24 03/11/2021   HGBA1C 5.8 (H) 03/11/2021     Assessment & Plan:   Problem List Items Addressed This Visit       Cardiovascular and Mediastinum   Essential hypertension    Would like better control.  Increasing losartan to 100 mg daily.  Labs have been ordered.  Patient to get labs in the next 7 to 10 days.      Relevant Medications   losartan (COZAAR) 100 MG tablet   Other Relevant Orders   CMP14+EGFR     Other   Prediabetes    Stable.  Labs ordered.  Continue Mounjaro.      Relevant Orders   Hemoglobin A1c   Obesity (BMI 30-39.9)    Continue Mounjaro.      Mixed hyperlipidemia     Lipid panel ordered.  No pharmacotherapy at this time.      Relevant Medications   losartan (COZAAR) 100 MG tablet   Other Relevant Orders   Lipid panel   Depression, recurrent (HCC)    Stable on Lexapro.  Continue.      Other Visit Diagnoses     Encounter for screening colonoscopy       Relevant Orders   Ambulatory referral to Gastroenterology   Immunization due       Relevant Orders   Tdap vaccine greater than or equal to 7yo IM (Completed)       Meds ordered this encounter  Medications   losartan (COZAAR) 100 MG tablet    Sig: Take 1 tablet (100 mg total) by mouth daily.    Dispense:  90 tablet    Refill:  3    Follow-up:  Return in about 6 months (around 11/24/2022).  [place]

## 2022-05-26 NOTE — Assessment & Plan Note (Signed)
Lipid panel ordered.  No pharmacotherapy at this time.

## 2022-05-26 NOTE — Assessment & Plan Note (Signed)
Stable.  Labs ordered.  Continue Mounjaro.

## 2022-05-26 NOTE — Assessment & Plan Note (Signed)
Stable on Lexapro.  Continue.

## 2022-05-26 NOTE — Patient Instructions (Signed)
Check BP @ home.  Labs ordered (do them in 7-10 days)  Colonoscopy referral placed.  Follow up in 6 months.

## 2022-05-26 NOTE — Assessment & Plan Note (Signed)
Would like better control.  Increasing losartan to 100 mg daily.  Labs have been ordered.  Patient to get labs in the next 7 to 10 days.

## 2022-06-06 ENCOUNTER — Telehealth: Payer: Self-pay | Admitting: Family Medicine

## 2022-06-06 NOTE — Telephone Encounter (Signed)
Patient dropped off recent labs for review in your box.

## 2022-06-15 ENCOUNTER — Other Ambulatory Visit: Payer: Self-pay | Admitting: Family Medicine

## 2022-07-26 ENCOUNTER — Telehealth: Payer: Self-pay

## 2022-07-26 DIAGNOSIS — F339 Major depressive disorder, recurrent, unspecified: Secondary | ICD-10-CM

## 2022-07-26 MED ORDER — ESCITALOPRAM OXALATE 20 MG PO TABS: 20.0000 mg | ORAL_TABLET | Freq: Every day | ORAL | 1 refills | Status: DC

## 2022-07-26 NOTE — Telephone Encounter (Signed)
Confirmed patient has been taking this medication daily no interruptions.

## 2022-07-26 NOTE — Telephone Encounter (Signed)
Prescription Request  07/26/2022  LOV: Visit date not found  What is the name of the medication or equipment? escitalopram (LEXAPRO) 20 MG tablet   Have you contacted your pharmacy to request a refill? Yes   Which pharmacy would you like this sent to?  [id number] [place] [place] - [address] [facility] [address] [place] [address] Phone: [phone] Fax: [phone]    Patient notified that their request is being sent to the clinical staff for review and that they should receive a response within 2 business days.   Please advise at Mobile [phone] (mobile)

## 2022-08-18 ENCOUNTER — Telehealth: Payer: Self-pay | Admitting: Family Medicine

## 2022-08-18 NOTE — Telephone Encounter (Signed)
Patient is requesting to be switched from [facility] to prescription for Ozempic to be [facility] . He states this is what his insurance will over and they have it on hand .

## 2022-08-18 NOTE — Telephone Encounter (Signed)
Patient is requesting to be switched from Mounjaro to prescription for Ozempic to be [facility] . He states this is what his insurance will over and they have it on hand .  

## 2022-08-21 ENCOUNTER — Telehealth: Payer: Self-pay

## 2022-08-21 ENCOUNTER — Other Ambulatory Visit: Payer: Self-pay | Admitting: Family Medicine

## 2022-08-21 MED ORDER — TIRZEPATIDE-WEIGHT MANAGEMENT 15 MG/0.5ML ~~LOC~~ SOAJ: 15.0000 mg | SUBCUTANEOUS | 0 refills | Status: DC

## 2022-08-21 NOTE — Telephone Encounter (Signed)
Pt is calling back he give the wrong name of the medication that his insurance will cover the [employer] will cover zepbound if his BMI is 35 or grater   [facility] [phone]

## 2022-08-21 NOTE — Telephone Encounter (Signed)
Patient called and states that he would like zepbound instead be called in to his pharmacy.

## 2022-08-25 NOTE — Telephone Encounter (Signed)
Curtis Sams, DO     I sent it on the 6th.

## 2022-08-25 NOTE — Telephone Encounter (Signed)
Patient is calling back for a follow up on med?

## 2022-10-07 ENCOUNTER — Other Ambulatory Visit: Payer: Self-pay | Admitting: Family Medicine

## 2022-10-29 ENCOUNTER — Other Ambulatory Visit: Payer: Self-pay | Admitting: Family Medicine

## 2022-10-29 DIAGNOSIS — F339 Major depressive disorder, recurrent, unspecified: Secondary | ICD-10-CM

## 2022-11-21 ENCOUNTER — Encounter: Payer: Self-pay | Admitting: *Deleted

## 2022-11-24 ENCOUNTER — Ambulatory Visit (INDEPENDENT_AMBULATORY_CARE_PROVIDER_SITE_OTHER): Payer: Commercial Managed Care - PPO | Admitting: Family Medicine

## 2022-11-24 VITALS — BP 149/93 | HR 83 | Temp 97.9°F | Ht 72.0 in | Wt 265.0 lb

## 2022-11-24 DIAGNOSIS — F339 Major depressive disorder, recurrent, unspecified: Secondary | ICD-10-CM

## 2022-11-24 DIAGNOSIS — I10 Essential (primary) hypertension: Secondary | ICD-10-CM

## 2022-11-24 DIAGNOSIS — E782 Mixed hyperlipidemia: Secondary | ICD-10-CM

## 2022-11-24 DIAGNOSIS — Z1211 Encounter for screening for malignant neoplasm of colon: Secondary | ICD-10-CM | POA: Diagnosis not present

## 2022-11-24 MED ORDER — LOSARTAN POTASSIUM-HCTZ 100-25 MG PO TABS: 1.0000 | ORAL_TABLET | Freq: Every day | ORAL | 3 refills | Status: DC

## 2022-11-24 NOTE — Patient Instructions (Signed)
You need labs in the next 2 weeks.  Medication as prescribed.  Follow up in 3 months.

## 2022-11-26 NOTE — Assessment & Plan Note (Signed)
Patient has labs obtained at work.  Advised to get labs done in the next 2 weeks.

## 2022-11-26 NOTE — Progress Notes (Signed)
Subjective:  Patient ID: Curtis Robertson, male    DOB: Sep 07, 1970  Age: 52 y.o. MRN: [id number]  CC:  Follow up   HPI:  52 year old male with the below mentioned medical problems presents for follow-up.  Patient reports overall he is doing well.  His blood pressure is elevated here today.  He is on losartan.  Will discuss adding additional medication to help hypertensive control.  Patient remains on Zepbound for weight loss.  Depression stable on Lexapro.  Patient due for shingles vaccine and colonoscopy.  Declines shingles vaccine.  Okay with referral for colonoscopy.  Patient Active Problem List   Diagnosis Date Noted   Obesity (BMI 30-39.9) 11/24/2021   Mixed hyperlipidemia 11/24/2021   Prediabetes 11/24/2021   Essential hypertension 03/09/2021   Depression, recurrent (HCC) 01/09/2013    Social Hx   Social History   Socioeconomic History   Marital status: Legally Separated    Spouse name: Not on file   Number of children: Not on file   Years of education: Not on file   Highest education level: Not on file  Occupational History   Not on file  Tobacco Use   Smoking status: Never   Smokeless tobacco: Never  Substance and Sexual Activity   Alcohol use: Not Currently   Drug use: Never   Sexual activity: Not on file  Other Topics Concern   Not on file  Social History Narrative   Not on file   Social Determinants of Health   Financial Resource Strain: Not on file  Food Insecurity: Not on file  Transportation Needs: Not on file  Physical Activity: Not on file  Stress: Not on file  Social Connections: Not on file    Review of Systems  Respiratory: Negative.    Cardiovascular: Negative.    Objective:  BP (!) 149/93   Pulse 83   Temp 97.9 F (36.6 C)   Ht 6' (1.829 m)   Wt 265 lb (120.2 kg)   SpO2 99%   BMI 35.94 kg/m      11/24/2022    8:56 AM 11/24/2022    8:32 AM 05/26/2022   10:20 AM  BP/Weight  Systolic BP 149 162 140  Diastolic BP 93 84 72   Wt. (Lbs)  265   BMI  35.94 kg/m2     Physical Exam Vitals and nursing note reviewed.  Constitutional:      General: He is not in acute distress.    Appearance: Normal appearance. He is obese.  HENT:     Head: Normocephalic and atraumatic.  Eyes:     General:        Right eye: No discharge.        Left eye: No discharge.     Conjunctiva/sclera: Conjunctivae normal.  Cardiovascular:     Rate and Rhythm: Normal rate and regular rhythm.  Pulmonary:     Effort: Pulmonary effort is normal.     Breath sounds: Normal breath sounds. No wheezing, rhonchi or rales.  Neurological:     Mental Status: He is alert.  Psychiatric:        Mood and Affect: Mood normal.        Behavior: Behavior normal.     Lab Results  Component Value Date   WBC 5.8 03/11/2021   HGB 14.2 03/11/2021   HCT 43.8 03/11/2021   PLT 224 03/11/2021   GLUCOSE 94 03/11/2021   CHOL 174 03/11/2021   TRIG 157 (H) 03/11/2021   HDL  40 03/11/2021   LDLCALC 106 (H) 03/11/2021   ALT 28 03/11/2021   AST 29 03/11/2021   NA 142 03/11/2021   K 4.7 03/11/2021   CL 103 03/11/2021   CREATININE 1.04 03/11/2021   BUN 16 03/11/2021   CO2 24 03/11/2021   HGBA1C 5.8 (H) 03/11/2021     Assessment & Plan:   Problem List Items Addressed This Visit       Cardiovascular and Mediastinum   Essential hypertension - Primary    Uncontrolled.  Adding HCTZ.      Relevant Medications   losartan-hydrochlorothiazide (HYZAAR) 100-25 MG tablet     Other   Mixed hyperlipidemia    Patient has labs obtained at work.  Advised to get labs done in the next 2 weeks.      Relevant Medications   losartan-hydrochlorothiazide (HYZAAR) 100-25 MG tablet   Depression, recurrent (HCC)    Stable on Lexapro.      Other Visit Diagnoses     Encounter for screening colonoscopy       Relevant Orders   Ambulatory referral to Gastroenterology       Meds ordered this encounter  Medications   losartan-hydrochlorothiazide (HYZAAR)  100-25 MG tablet    Sig: Take 1 tablet by mouth daily.    Dispense:  90 tablet    Refill:  3    Follow-up:  Return in about 3 months (around 02/24/2023).  Everlene Other DO [facility]

## 2022-11-26 NOTE — Assessment & Plan Note (Signed)
Uncontrolled. Adding HCTZ.

## 2022-11-26 NOTE — Assessment & Plan Note (Signed)
Stable on Lexapro

## 2022-11-27 ENCOUNTER — Encounter: Payer: Self-pay | Admitting: *Deleted

## 2023-03-02 ENCOUNTER — Ambulatory Visit: Payer: Commercial Managed Care - PPO | Admitting: Family Medicine

## 2023-03-12 ENCOUNTER — Ambulatory Visit (INDEPENDENT_AMBULATORY_CARE_PROVIDER_SITE_OTHER): Payer: Commercial Managed Care - PPO | Admitting: Family Medicine

## 2023-03-12 VITALS — BP 122/80 | HR 89 | Temp 97.3°F | Ht 72.0 in | Wt 235.6 lb

## 2023-03-12 DIAGNOSIS — F419 Anxiety disorder, unspecified: Secondary | ICD-10-CM

## 2023-03-12 DIAGNOSIS — I10 Essential (primary) hypertension: Secondary | ICD-10-CM

## 2023-03-12 MED ORDER — BUSPIRONE HCL 7.5 MG PO TABS: 7.5000 mg | ORAL_TABLET | Freq: Two times a day (BID) | ORAL | 1 refills | Status: DC

## 2023-03-12 NOTE — Assessment & Plan Note (Signed)
Uncontrolled/worsening.  Adding BuSpar.

## 2023-03-12 NOTE — Progress Notes (Signed)
Subjective:  Patient ID: Curtis Robertson, male    DOB: 08-09-1970  Age: 52 y.o. MRN: [id number]  CC: Follow-up   HPI:  52 year old male with the below mentioned medical problems presents for follow-up.  Hypertension is improved.  He is at goal.  He is compliant with losartan/HCTZ.  Patient reports that he has changed positions at work.  He is now on patrol duty.  This has heightened his anxiety.  He states that his anxiety is uncontrolled and seems to be worsening as of late.  States it is manageable but still quite difficult for him.  He would like to discuss this today.  Patient Active Problem List   Diagnosis Date Noted   Anxiety 03/12/2023   Obesity (BMI 30-39.9) 11/24/2021   Mixed hyperlipidemia 11/24/2021   Prediabetes 11/24/2021   Essential hypertension 03/09/2021   Depression, recurrent (HCC) 01/09/2013    Social Hx   Social History   Socioeconomic History   Marital status: Legally Separated    Spouse name: Not on file   Number of children: Not on file   Years of education: Not on file   Highest education level: Not on file  Occupational History   Not on file  Tobacco Use   Smoking status: Never   Smokeless tobacco: Never  Substance and Sexual Activity   Alcohol use: Not Currently   Drug use: Never   Sexual activity: Not on file  Other Topics Concern   Not on file  Social History Narrative   Not on file   Social Determinants of Health   Financial Resource Strain: Not on file  Food Insecurity: Not on file  Transportation Needs: Not on file  Physical Activity: Not on file  Stress: Not on file  Social Connections: Not on file    Review of Systems Per HPI  Objective:  BP 122/80   Pulse 89   Temp (!) 97.3 F (36.3 C)   Ht 6' (1.829 m)   Wt 235 lb 9.6 oz (106.9 kg)   SpO2 97%   BMI 31.95 kg/m      03/12/2023    9:23 AM 11/24/2022    8:56 AM 11/24/2022    8:32 AM  BP/Weight  Systolic BP 122 149 162  Diastolic BP 80 93 84  Wt. (Lbs) 235.6   265  BMI 31.95 kg/m2  35.94 kg/m2    Physical Exam Vitals and nursing note reviewed.  Constitutional:      General: He is not in acute distress.    Appearance: Normal appearance.  HENT:     Head: Normocephalic and atraumatic.  Cardiovascular:     Rate and Rhythm: Normal rate and regular rhythm.  Pulmonary:     Effort: Pulmonary effort is normal.     Breath sounds: Normal breath sounds. No wheezing, rhonchi or rales.  Neurological:     Mental Status: He is alert.  Psychiatric:        Mood and Affect: Mood normal.        Behavior: Behavior normal.     Lab Results  Component Value Date   WBC 5.8 03/11/2021   HGB 14.2 03/11/2021   HCT 43.8 03/11/2021   PLT 224 03/11/2021   GLUCOSE 94 03/11/2021   CHOL 174 03/11/2021   TRIG 157 (H) 03/11/2021   HDL 40 03/11/2021   LDLCALC 106 (H) 03/11/2021   ALT 28 03/11/2021   AST 29 03/11/2021   NA 142 03/11/2021   K 4.7 03/11/2021  CL 103 03/11/2021   CREATININE 1.04 03/11/2021   BUN 16 03/11/2021   CO2 24 03/11/2021   HGBA1C 5.8 (H) 03/11/2021     Assessment & Plan:   Problem List Items Addressed This Visit       Cardiovascular and Mediastinum   Essential hypertension - Primary    Well-controlled.  Continue losartan/HCTZ.        Other   Anxiety    Uncontrolled/worsening.  Adding BuSpar.       Relevant Medications   busPIRone (BUSPAR) 7.5 MG tablet    Meds ordered this encounter  Medications   busPIRone (BUSPAR) 7.5 MG tablet    Sig: Take 1 tablet (7.5 mg total) by mouth 2 (two) times daily.    Dispense:  180 tablet    Refill:  1    Follow-up:  Return in about 6 months (around 09/09/2023) for Follow up Chronic medical issues.  Everlene Other DO [facility]

## 2023-03-12 NOTE — Patient Instructions (Signed)
Continue Lexapro  Adding BuSpar.  Continue other medications.  Follow-up in 6 months.

## 2023-03-12 NOTE — Assessment & Plan Note (Signed)
Well-controlled.  Continue losartan/HCTZ.

## 2023-03-26 ENCOUNTER — Ambulatory Visit: Payer: Commercial Managed Care - PPO | Admitting: Family Medicine

## 2023-03-26 VITALS — BP 116/82 | HR 86 | Temp 98.2°F | Ht 72.0 in | Wt 224.8 lb

## 2023-03-26 DIAGNOSIS — F419 Anxiety disorder, unspecified: Secondary | ICD-10-CM | POA: Diagnosis not present

## 2023-03-26 MED ORDER — BUSPIRONE HCL 10 MG PO TABS: 10.0000 mg | ORAL_TABLET | Freq: Two times a day (BID) | ORAL | 3 refills | Status: DC

## 2023-03-26 MED ORDER — HYDROXYZINE PAMOATE 25 MG PO CAPS: 25.0000 mg | ORAL_CAPSULE | Freq: Three times a day (TID) | ORAL | 0 refills | Status: DC | PRN

## 2023-03-26 NOTE — Assessment & Plan Note (Signed)
Not well controlled.  Increasing Buspar. PRN Hydroxyzine.

## 2023-03-26 NOTE — Progress Notes (Signed)
Subjective:  Patient ID: Curtis Robertson, male    DOB: [birth year]  Age: 52 y.o. MRN: [id number]  CC:  Anxiety   HPI:  52 year old male presents for evaluation of anxiety.  Recently started on Buspar (11/25). He reports improvement but anxiety is still not well controlled. He is having some associated insomnia. Continues to be compliant with Lexapro. Wants to discuss treatment options today.  Patient Active Problem List   Diagnosis Date Noted   Anxiety 03/12/2023   Obesity (BMI 30-39.9) 11/24/2021   Mixed hyperlipidemia 11/24/2021   Prediabetes 11/24/2021   Essential hypertension 03/09/2021   Depression, recurrent (HCC) 01/09/2013    Social Hx   Social History   Socioeconomic History   Marital status: Legally Separated    Spouse name: Not on file   Number of children: Not on file   Years of education: Not on file   Highest education level: Not on file  Occupational History   Not on file  Tobacco Use   Smoking status: Never   Smokeless tobacco: Never  Substance and Sexual Activity   Alcohol use: Not Currently   Drug use: Never   Sexual activity: Not on file  Other Topics Concern   Not on file  Social History Narrative   Not on file   Social Determinants of Health   Financial Resource Strain: Not on file  Food Insecurity: Not on file  Transportation Needs: Not on file  Physical Activity: Not on file  Stress: Not on file  Social Connections: Not on file    Review of Systems Per HPI  Objective:  BP 116/82   Pulse 86   Temp 98.2 F (36.8 C)   Ht 6' (1.829 m)   Wt 224 lb 12.8 oz (102 kg)   SpO2 97%   BMI 30.49 kg/m      03/26/2023    4:17 PM 03/12/2023    9:23 AM 11/24/2022    8:56 AM  BP/Weight  Systolic BP 116 122 149  Diastolic BP 82 80 93  Wt. (Lbs) 224.8 235.6   BMI 30.49 kg/m2 31.95 kg/m2     Physical Exam Vitals and nursing note reviewed.  Constitutional:      General: He is not in acute distress.    Appearance: Normal appearance.   HENT:     Head: Normocephalic and atraumatic.  Pulmonary:     Effort: Pulmonary effort is normal. No respiratory distress.  Neurological:     Mental Status: He is alert.  Psychiatric:     Comments: Anxious.     Lab Results  Component Value Date   WBC 5.8 03/11/2021   HGB 14.2 03/11/2021   HCT 43.8 03/11/2021   PLT 224 03/11/2021   GLUCOSE 94 03/11/2021   CHOL 174 03/11/2021   TRIG 157 (H) 03/11/2021   HDL 40 03/11/2021   LDLCALC 106 (H) 03/11/2021   ALT 28 03/11/2021   AST 29 03/11/2021   NA 142 03/11/2021   K 4.7 03/11/2021   CL 103 03/11/2021   CREATININE 1.04 03/11/2021   BUN 16 03/11/2021   CO2 24 03/11/2021   HGBA1C 5.8 (H) 03/11/2021     Assessment & Plan:   Problem List Items Addressed This Visit       Other   Anxiety - Primary    Not well controlled.  Increasing Buspar. PRN Hydroxyzine.       Relevant Medications   busPIRone (BUSPAR) 10 MG tablet   hydrOXYzine (VISTARIL) 25  MG capsule    Meds ordered this encounter  Medications   busPIRone (BUSPAR) 10 MG tablet    Sig: Take 1 tablet (10 mg total) by mouth 2 (two) times daily.    Dispense:  60 tablet    Refill:  3   hydrOXYzine (VISTARIL) 25 MG capsule    Sig: Take 1 capsule (25 mg total) by mouth every 8 (eight) hours as needed for anxiety.    Dispense:  30 capsule    Refill:  0    Follow-up:  Return in about 6 weeks (around 05/07/2023).  Everlene Other DO [facility]

## 2023-03-26 NOTE — Patient Instructions (Signed)
Medications as prescribed.  Follow up in 6 weeks.

## 2023-04-02 ENCOUNTER — Telehealth: Payer: Self-pay | Admitting: *Deleted

## 2023-04-02 NOTE — Telephone Encounter (Signed)
Outcome- Zepbound Approved today by [employer] 2017 CaseId:[id number];Status:Approved;Review Type:Prior Auth;Coverage Start Date:03/03/2023;Coverage End Date:04/01/2024;

## 2023-04-17 ENCOUNTER — Other Ambulatory Visit: Payer: Self-pay | Admitting: Family Medicine

## 2023-04-26 ENCOUNTER — Encounter: Payer: Self-pay | Admitting: Family Medicine

## 2023-04-26 ENCOUNTER — Ambulatory Visit (INDEPENDENT_AMBULATORY_CARE_PROVIDER_SITE_OTHER): Payer: Commercial Managed Care - PPO | Admitting: Family Medicine

## 2023-04-26 VITALS — BP 130/92 | HR 77 | Ht 72.0 in | Wt 231.8 lb

## 2023-04-26 DIAGNOSIS — F419 Anxiety disorder, unspecified: Secondary | ICD-10-CM | POA: Diagnosis not present

## 2023-04-26 MED ORDER — ALPRAZOLAM 0.5 MG PO TABS: 0.5000 mg | ORAL_TABLET | Freq: Two times a day (BID) | ORAL | 1 refills | Status: DC | PRN

## 2023-04-26 NOTE — Patient Instructions (Signed)
 Medication as needed.  Use sparingly.  Do not use when driving or operating machinery.  Do not use at work.

## 2023-04-26 NOTE — Progress Notes (Signed)
 Subjective:  Patient ID: Curtis Robertson, male    DOB: 09-25-1970  Age: 53 y.o. MRN: [id number]  CC:   Chief Complaint  Patient presents with   Medical Management of Chronic Issues    Pt. Incurring issues with Medication after dosage increase.     HPI:  53 year old male presents with continued anxiety.  Patient currently on BuSpar  and Lexapro .  He has not used the Atarax .  He states that he has a pending promotion at work.  He is very stressed.  He states that overall he seems to be improving but has periods of time where he has severe anxiety and panic.  Patient is interested and having something to use if needed for severe anxiety.  Has taken alprazolam  in the past many years ago with good response.  He would like to discuss this today.  Patient Active Problem List   Diagnosis Date Noted   Anxiety 03/12/2023   Obesity (BMI 30-39.9) 11/24/2021   Mixed hyperlipidemia 11/24/2021   Prediabetes 11/24/2021   Essential hypertension 03/09/2021   Depression, recurrent (HCC) 01/09/2013    Social Hx   Social History   Socioeconomic History   Marital status: Legally Separated    Spouse name: Not on file   Number of children: Not on file   Years of education: Not on file   Highest education level: Not on file  Occupational History   Not on file  Tobacco Use   Smoking status: Never   Smokeless tobacco: Never  Substance and Sexual Activity   Alcohol use: Not Currently   Drug use: Never   Sexual activity: Not on file  Other Topics Concern   Not on file  Social History Narrative   Not on file   Social Drivers of Health   Financial Resource Strain: Not on file  Food Insecurity: Not on file  Transportation Needs: Not on file  Physical Activity: Not on file  Stress: Not on file  Social Connections: Not on file    Review of Systems Per HPI  Objective:  BP (!) 130/92   Pulse 77   Ht 6' (1.829 m)   Wt 231 lb 12.8 oz (105.1 kg)   SpO2 100%   BMI 31.44 kg/m       04/26/2023    2:05 PM 03/26/2023    4:17 PM 03/12/2023    9:23 AM  BP/Weight  Systolic BP 130 116 122  Diastolic BP 92 82 80  Wt. (Lbs) 231.8 224.8 235.6  BMI 31.44 kg/m2 30.49 kg/m2 31.95 kg/m2    Physical Exam Vitals and nursing note reviewed.  Constitutional:      General: He is not in acute distress.    Appearance: Normal appearance.  HENT:     Head: Normocephalic and atraumatic.  Cardiovascular:     Rate and Rhythm: Normal rate and regular rhythm.  Pulmonary:     Effort: Pulmonary effort is normal.     Breath sounds: Normal breath sounds. No wheezing, rhonchi or rales.  Neurological:     Mental Status: He is alert.     Lab Results  Component Value Date   WBC 5.8 03/11/2021   HGB 14.2 03/11/2021   HCT 43.8 03/11/2021   PLT 224 03/11/2021   GLUCOSE 94 03/11/2021   CHOL 174 03/11/2021   TRIG 157 (H) 03/11/2021   HDL 40 03/11/2021   LDLCALC 106 (H) 03/11/2021   ALT 28 03/11/2021   AST 29 03/11/2021   NA 142 03/11/2021  K 4.7 03/11/2021   CL 103 03/11/2021   CREATININE 1.04 03/11/2021   BUN 16 03/11/2021   CO2 24 03/11/2021   HGBA1C 5.8 (H) 03/11/2021     Assessment & Plan:   Problem List Items Addressed This Visit       Other   Anxiety - Primary   Improved but still problematic despite Lexapro  and BuSpar .  Has periods of time where he has severe anxiety/panic.  Continue Lexapro  and BuSpar .  Alprazolam  as needed.      Relevant Medications   ALPRAZolam  (XANAX ) 0.5 MG tablet    Meds ordered this encounter  Medications   DISCONTD: ALPRAZolam  (XANAX ) 0.5 MG tablet    Sig: Take 1 tablet (0.5 mg total) by mouth 2 (two) times daily as needed for anxiety. Use sparingly. Do not use at work or when operating vehicle/machinery.    Dispense:  60 tablet    Refill:  1   ALPRAZolam  (XANAX ) 0.5 MG tablet    Sig: Take 1 tablet (0.5 mg total) by mouth 2 (two) times daily as needed for anxiety. Use sparingly. Do not use at work or when operating vehicle/machinery.     Dispense:  60 tablet    Refill:  1    Follow-up:  3-6 months  Mancel Lardizabal Bluford DO [facility]

## 2023-04-26 NOTE — Assessment & Plan Note (Addendum)
 Improved but still problematic despite Lexapro and BuSpar.  Has periods of time where he has severe anxiety/panic.  Continue Lexapro and BuSpar.  Alprazolam as needed.

## 2023-05-07 ENCOUNTER — Ambulatory Visit: Payer: Commercial Managed Care - PPO | Admitting: Family Medicine

## 2023-05-13 ENCOUNTER — Other Ambulatory Visit: Payer: Self-pay | Admitting: Family Medicine

## 2023-05-14 ENCOUNTER — Other Ambulatory Visit: Payer: Self-pay

## 2023-05-14 DIAGNOSIS — I10 Essential (primary) hypertension: Secondary | ICD-10-CM

## 2023-05-14 MED ORDER — LOSARTAN POTASSIUM 100 MG PO TABS
100.0000 mg | ORAL_TABLET | Freq: Every day | ORAL | 2 refills | Status: DC
Start: 2023-05-14 — End: 2024-03-04

## 2023-05-30 ENCOUNTER — Encounter (INDEPENDENT_AMBULATORY_CARE_PROVIDER_SITE_OTHER): Payer: Self-pay | Admitting: *Deleted

## 2023-07-02 ENCOUNTER — Other Ambulatory Visit: Payer: Self-pay | Admitting: Family Medicine

## 2023-07-02 NOTE — Telephone Encounter (Signed)
 Copied from CRM [id number]. Topic: Clinical - Medication Refill >> Jul 02, 2023  4:27 PM Antwanette L wrote: Most Recent Primary Care Visit:  Provider: Tommie Sams  Department: [facility]  Visit Type: OFFICE VISIT  Date: 04/26/2023  Medication: busPIRone (BUSPAR) 10 MG tablet ALPRAZolam (XANAX) 0.5 MG tablet  Has the patient contacted their pharmacy? No   Is this the correct pharmacy for this prescription? Yes  This is the patient's preferred pharmacy:  [id number] - [place], [place] - [address] AT [facility] [address] [place] [address] Phone: [phone] Fax: [phone]   Has the prescription been filled recently? No  Is the patient out of the medication? No  Has the patient been seen for an appointment in the last year OR does the patient have an upcoming appointment? Yes  Can we respond through MyChart? No. Contact patient by phone.  Agent: Please be advised that Rx refills may take up to 3 business days. We ask that you follow-up with your pharmacy.

## 2023-07-02 NOTE — Telephone Encounter (Signed)
 Last Fill: Buspar: 04/18/23     Alprazolam: 04/26/23 60 tabs/1 RF  Last OV: 04/26/23 Next OV: None Scheduled  Routing to provider for review/authorization.

## 2023-07-03 MED ORDER — ALPRAZOLAM 0.5 MG PO TABS: 0.5000 mg | ORAL_TABLET | Freq: Two times a day (BID) | ORAL | 1 refills | Status: DC | PRN

## 2023-07-03 MED ORDER — BUSPIRONE HCL 10 MG PO TABS: 10.0000 mg | ORAL_TABLET | Freq: Two times a day (BID) | ORAL | 2 refills | Status: DC

## 2023-07-15 ENCOUNTER — Other Ambulatory Visit: Payer: Self-pay | Admitting: Family Medicine

## 2023-07-15 DIAGNOSIS — F339 Major depressive disorder, recurrent, unspecified: Secondary | ICD-10-CM

## 2023-11-11 ENCOUNTER — Other Ambulatory Visit: Payer: Self-pay | Admitting: Family Medicine

## 2023-11-12 ENCOUNTER — Other Ambulatory Visit: Payer: Self-pay

## 2023-11-12 MED ORDER — LOSARTAN POTASSIUM-HCTZ 100-25 MG PO TABS: 1.0000 | ORAL_TABLET | Freq: Every day | ORAL | 3 refills | Status: AC

## 2023-12-20 ENCOUNTER — Other Ambulatory Visit: Payer: Self-pay | Admitting: Family Medicine

## 2024-01-17 ENCOUNTER — Other Ambulatory Visit: Payer: Self-pay | Admitting: Family Medicine

## 2024-01-17 DIAGNOSIS — F339 Major depressive disorder, recurrent, unspecified: Secondary | ICD-10-CM

## 2024-03-04 ENCOUNTER — Other Ambulatory Visit (HOSPITAL_COMMUNITY): Payer: Self-pay

## 2024-03-04 ENCOUNTER — Other Ambulatory Visit: Payer: Self-pay | Admitting: Family Medicine

## 2024-03-04 DIAGNOSIS — I10 Essential (primary) hypertension: Secondary | ICD-10-CM

## 2024-04-01 ENCOUNTER — Other Ambulatory Visit: Payer: Self-pay | Admitting: Family Medicine
